# Patient Record
Sex: Male | Born: 1960
Health system: Southern US, Community
[De-identification: ages and names within clinical notes are randomized; demographics above are authoritative.]

## PROBLEM LIST (undated history)

## (undated) DIAGNOSIS — M1990 Unspecified osteoarthritis, unspecified site: Secondary | ICD-10-CM

## (undated) DIAGNOSIS — E119 Type 2 diabetes mellitus without complications: Secondary | ICD-10-CM

## (undated) DIAGNOSIS — I639 Cerebral infarction, unspecified: Secondary | ICD-10-CM

## (undated) DIAGNOSIS — F329 Major depressive disorder, single episode, unspecified: Secondary | ICD-10-CM

## (undated) DIAGNOSIS — E785 Hyperlipidemia, unspecified: Secondary | ICD-10-CM

## (undated) DIAGNOSIS — F32A Depression, unspecified: Secondary | ICD-10-CM

## (undated) DIAGNOSIS — I10 Essential (primary) hypertension: Secondary | ICD-10-CM

## (undated) HISTORY — PX: TIBIA FRACTURE SURGERY: SHX806

## (undated) HISTORY — PX: OTHER SURGICAL HISTORY: SHX169

## (undated) HISTORY — PX: TONSILLECTOMY: SUR1361

## (undated) HISTORY — DX: Type 2 diabetes mellitus without complications: E11.9

## (undated) HISTORY — DX: Hyperlipidemia, unspecified: E78.5

---

## 2005-05-18 ENCOUNTER — Ambulatory Visit (HOSPITAL_COMMUNITY): Admission: RE | Admit: 2005-05-18 | Discharge: 2005-05-18 | Payer: Self-pay | Admitting: *Deleted

## 2005-05-18 ENCOUNTER — Ambulatory Visit (HOSPITAL_BASED_OUTPATIENT_CLINIC_OR_DEPARTMENT_OTHER): Admission: RE | Admit: 2005-05-18 | Discharge: 2005-05-18 | Payer: Self-pay | Admitting: *Deleted

## 2005-09-06 DIAGNOSIS — I639 Cerebral infarction, unspecified: Secondary | ICD-10-CM

## 2005-09-06 HISTORY — DX: Cerebral infarction, unspecified: I63.9

## 2005-10-26 ENCOUNTER — Inpatient Hospital Stay (HOSPITAL_COMMUNITY)
Admission: RE | Admit: 2005-10-26 | Discharge: 2005-11-23 | Payer: Self-pay | Admitting: Physical Medicine & Rehabilitation

## 2005-10-26 ENCOUNTER — Ambulatory Visit: Payer: Self-pay | Admitting: Physical Medicine & Rehabilitation

## 2005-12-27 ENCOUNTER — Encounter
Admission: RE | Admit: 2005-12-27 | Discharge: 2006-03-27 | Payer: Self-pay | Admitting: Physical Medicine & Rehabilitation

## 2005-12-27 ENCOUNTER — Ambulatory Visit: Payer: Self-pay | Admitting: Physical Medicine & Rehabilitation

## 2006-02-22 ENCOUNTER — Ambulatory Visit: Payer: Self-pay | Admitting: Physical Medicine & Rehabilitation

## 2010-09-27 ENCOUNTER — Encounter: Payer: Self-pay | Admitting: Physical Medicine & Rehabilitation

## 2012-06-08 ENCOUNTER — Encounter (HOSPITAL_COMMUNITY): Payer: Self-pay | Admitting: Pharmacy Technician
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
## 2012-06-15 ENCOUNTER — Encounter (HOSPITAL_COMMUNITY): Payer: Self-pay

## 2012-06-15 ENCOUNTER — Ambulatory Visit (HOSPITAL_COMMUNITY)
Admission: RE | Admit: 2012-06-15 | Discharge: 2012-06-15 | Disposition: A | Discharge status: Home / Self Care | Payer: Medicare Other | Source: Ambulatory Visit | Attending: Orthopedic Surgery | Admitting: Orthopedic Surgery

## 2012-06-15 ENCOUNTER — Encounter (HOSPITAL_COMMUNITY)
Admission: RE | Admit: 2012-06-15 | Discharge: 2012-06-15 | Disposition: A | Discharge status: Home / Self Care | Payer: Medicare Other | Source: Ambulatory Visit | Attending: Orthopedic Surgery | Admitting: Orthopedic Surgery

## 2012-06-15 DIAGNOSIS — Z01812 Encounter for preprocedural laboratory examination: Secondary | ICD-10-CM | POA: Insufficient documentation

## 2012-06-15 DIAGNOSIS — Z01818 Encounter for other preprocedural examination: Secondary | ICD-10-CM | POA: Insufficient documentation

## 2012-06-15 HISTORY — DX: Major depressive disorder, single episode, unspecified: F32.9

## 2012-06-15 HISTORY — DX: Cerebral infarction, unspecified: I63.9

## 2012-06-15 HISTORY — DX: Essential (primary) hypertension: I10

## 2012-06-15 HISTORY — DX: Unspecified osteoarthritis, unspecified site: M19.90

## 2012-06-15 HISTORY — DX: Depression, unspecified: F32.A

## 2012-06-15 LAB — URINALYSIS, ROUTINE W REFLEX MICROSCOPIC
Bilirubin Urine: NEGATIVE
Glucose, UA: >1000 mg/dL — AB
Hgb urine dipstick: NEGATIVE
Specific Gravity, Urine: 1.022 (ref 1.005–1.030)
Urobilinogen, UA: 0.2 mg/dL (ref 0.0–1.0)

## 2012-06-15 LAB — CBC
Hemoglobin: 15.7 g/dL (ref 13.0–17.0)
MCH: 29.8 pg (ref 26.0–34.0)
MCHC: 34.4 g/dL (ref 30.0–36.0)

## 2012-06-15 LAB — BASIC METABOLIC PANEL
BUN: 21 mg/dL (ref 6–23)
Calcium: 9.2 mg/dL (ref 8.4–10.5)
GFR calc non Af Amer: >90 mL/min (ref >90)
Glucose, Bld: 179 mg/dL — ABNORMAL HIGH (ref 70–99)
Potassium: 3.6 mEq/L (ref 3.5–5.1)

## 2012-06-15 LAB — SURGICAL PCR SCREEN
MRSA, PCR: NEGATIVE
Staphylococcus aureus: POSITIVE — AB

## 2012-06-15 LAB — PROTIME-INR: Prothrombin Time: 13.1 seconds (ref 11.6–15.2)

## 2012-06-15 NOTE — Pre-Procedure Instructions (Signed)
PT'S URINE GLUCOSE GREATER THAN 1000 AND BLOOD GLUCOSE 179.  NO HX GIVEN BY PT OF ELEVATED GLUCOSE.  BMET AND URINALYSIS REPORTS FAXED TO DR. Nilsa Nutting OFFICE VIA [employer] FAX.

## 2012-06-15 NOTE — Pre-Procedure Instructions (Signed)
PCR, CBC, BMET, PT, PTT, UA, CXR WERE DONE TODAY AT [facility] AS PER ORDERS DR. OLIN AND ANESTHESIOLOGIST'S GUIDELINES. PT HAS EKG REPORT ON HIS CHART FROM DR. PERRY ([facility], [place]

## 2012-06-15 NOTE — Patient Instructions (Addendum)
YOUR SURGERY IS SCHEDULED AT [facility]  ON:  Tuesday  10/15  AT 10:30 AM  REPORT TO [facility] SHORT STAY [facility]ENTER AT:  8:00 AM      PHONE # FOR SHORT STAY IS [phone]  DO NOT EAT OR DRINK ANYTHING AFTER MIDNIGHT THE NIGHT BEFORE YOUR SURGERY.  YOU MAY BRUSH YOUR TEETH, RINSE OUT YOUR MOUTH--BUT NO WATER, NO FOOD, NO CHEWING GUM, NO MINTS, NO CANDIES, NO CHEWING TOBACCO.  PLEASE TAKE THE FOLLOWING MEDICATIONS THE AM OF YOUR SURGERY WITH A FEW SIPS OF WATER:  BIS[AR. FLUOXETINE, METOPROLOL, CRESTOR   IF YOU USE INHALERS--USE YOUR INHALERS THE AM OF YOUR SURGERY AND BRING INHALERS TO THE HOSPITAL -TAKE TO SURGERY.    IF YOU ARE DIABETIC:  DO NOT TAKE ANY DIABETIC MEDICATIONS THE AM OF YOUR SURGERY.  IF YOU TAKE INSULIN IN THE EVENINGS--PLEASE ONLY TAKE 1/2 NORMAL EVENING DOSE THE NIGHT BEFORE YOUR SURGERY.  NO INSULIN THE AM OF YOUR SURGERY.  IF YOU HAVE SLEEP APNEA AND USE CPAP OR BIPAP--PLEASE BRING THE MASK AND THE TUBING.  DO NOT BRING YOUR MACHINE.  DO NOT BRING VALUABLES, MONEY, CREDIT CARDS.  DO NOT WEAR JEWELRY, MAKE-UP, NAIL POLISH AND NO METAL PINS OR CLIPS IN YOUR HAIR. CONTACT LENS, DENTURES / PARTIALS, GLASSES SHOULD NOT BE WORN TO SURGERY AND IN MOST CASES-HEARING AIDS WILL NEED TO BE REMOVED.  BRING YOUR GLASSES CASE, ANY EQUIPMENT NEEDED FOR YOUR CONTACT LENS. FOR PATIENTS ADMITTED TO THE HOSPITAL--CHECK OUT TIME THE DAY OF DISCHARGE IS 11:00 AM.  ALL INPATIENT ROOMS ARE PRIVATE - WITH BATHROOM, TELEPHONE, TELEVISION AND WIFI INTERNET.  IF YOU ARE BEING DISCHARGED THE SAME DAY OF YOUR SURGERY--YOU CAN NOT DRIVE YOURSELF HOME--AND SHOULD NOT GO HOME ALONE BY TAXI OR BUS.  NO DRIVING OR OPERATING MACHINERY FOR 24 HOURS FOLLOWING ANESTHESIA / PAIN MEDICATIONS.  PLEASE MAKE ARRANGEMENTS FOR SOMEONE TO BE WITH YOU AT HOME THE FIRST 24 HOURS AFTER SURGERY. RESPONSIBLE DRIVER'S NAME___________________________                                               PHONE #    _______________________                                  PLEASE READ OVER ANY  FACT SHEETS THAT YOU WERE GIVEN: MRSA INFORMATION, BLOOD TRANSFUSION INFORMATION, INCENTIVE SPIROMETER INFORMATION.

## 2012-06-20 ENCOUNTER — Ambulatory Visit (HOSPITAL_COMMUNITY): Payer: Medicare Other

## 2012-06-20 ENCOUNTER — Encounter (HOSPITAL_COMMUNITY): Payer: Self-pay | Admitting: Certified Registered Nurse Anesthetist

## 2012-06-20 ENCOUNTER — Encounter (HOSPITAL_COMMUNITY)
Admission: RE | Disposition: A | Discharge status: Home / Self Care | Payer: Self-pay | Source: Ambulatory Visit | Attending: Orthopedic Surgery

## 2012-06-20 ENCOUNTER — Ambulatory Visit (HOSPITAL_COMMUNITY): Payer: Medicare Other | Admitting: Certified Registered Nurse Anesthetist

## 2012-06-20 ENCOUNTER — Observation Stay (HOSPITAL_COMMUNITY)
Admission: RE | Admit: 2012-06-20 | Discharge: 2012-06-21 | Disposition: A | Discharge status: Home / Self Care | Payer: Medicare Other | Source: Ambulatory Visit | Attending: Orthopedic Surgery | Admitting: Orthopedic Surgery

## 2012-06-20 ENCOUNTER — Encounter (HOSPITAL_COMMUNITY): Payer: Self-pay | Admitting: *Deleted

## 2012-06-20 DIAGNOSIS — F3289 Other specified depressive episodes: Secondary | ICD-10-CM | POA: Insufficient documentation

## 2012-06-20 DIAGNOSIS — F329 Major depressive disorder, single episode, unspecified: Secondary | ICD-10-CM | POA: Insufficient documentation

## 2012-06-20 DIAGNOSIS — I10 Essential (primary) hypertension: Secondary | ICD-10-CM | POA: Insufficient documentation

## 2012-06-20 DIAGNOSIS — R29898 Other symptoms and signs involving the musculoskeletal system: Secondary | ICD-10-CM | POA: Insufficient documentation

## 2012-06-20 DIAGNOSIS — I69998 Other sequelae following unspecified cerebrovascular disease: Secondary | ICD-10-CM | POA: Insufficient documentation

## 2012-06-20 DIAGNOSIS — Z472 Encounter for removal of internal fixation device: Principal | ICD-10-CM | POA: Insufficient documentation

## 2012-06-20 DIAGNOSIS — Z79899 Other long term (current) drug therapy: Secondary | ICD-10-CM | POA: Insufficient documentation

## 2012-06-20 DIAGNOSIS — M1710 Unilateral primary osteoarthritis, unspecified knee: Secondary | ICD-10-CM | POA: Insufficient documentation

## 2012-06-20 DIAGNOSIS — Z969 Presence of functional implant, unspecified: Secondary | ICD-10-CM

## 2012-06-20 HISTORY — PX: HARDWARE REMOVAL: SHX979

## 2012-06-20 LAB — TYPE AND SCREEN
ABO/RH(D): O POS
Antibody Screen: NEGATIVE

## 2012-06-20 LAB — ABO/RH: ABO/RH(D): O POS

## 2012-06-20 SURGERY — REMOVAL, HARDWARE
Anesthesia: Spinal | Site: Knee | Laterality: Right | Wound class: Clean

## 2012-06-20 MED ORDER — ONDANSETRON HCL 4 MG/2ML IJ SOLN
INTRAMUSCULAR | Status: DC | PRN
  Administered 2012-06-20: 4 mg via INTRAVENOUS

## 2012-06-20 MED ORDER — DOCUSATE SODIUM 100 MG PO CAPS
100.0000 mg | ORAL_CAPSULE | Freq: Two times a day (BID) | ORAL | Status: DC
  Administered 2012-06-20 – 2012-06-21 (×2): 100 mg via ORAL

## 2012-06-20 MED ORDER — BUSPIRONE HCL 15 MG PO TABS
15.0000 mg | ORAL_TABLET | Freq: Every day | ORAL | Status: DC
  Administered 2012-06-21: 15 mg via ORAL
  Filled 2012-06-20 (×2): qty 1

## 2012-06-20 MED ORDER — ACETAMINOPHEN 10 MG/ML IV SOLN
INTRAVENOUS | Status: DC | PRN
  Administered 2012-06-20: 1000 mg via INTRAVENOUS

## 2012-06-20 MED ORDER — LACTATED RINGERS IV SOLN
INTRAVENOUS | Status: DC | PRN
  Administered 2012-06-20: 10:00:00 via INTRAVENOUS

## 2012-06-20 MED ORDER — HYDROMORPHONE HCL PF 1 MG/ML IJ SOLN
0.5000 mg | INTRAMUSCULAR | Status: DC | PRN
  Administered 2012-06-20: 0.5 mg via INTRAVENOUS
  Filled 2012-06-20: qty 1

## 2012-06-20 MED ORDER — PROPOFOL 10 MG/ML IV BOLUS
INTRAVENOUS | Status: DC | PRN
  Administered 2012-06-20: 200 mg via INTRAVENOUS

## 2012-06-20 MED ORDER — SODIUM CHLORIDE 0.9 % IV SOLN
INTRAVENOUS | Status: DC
  Administered 2012-06-20 – 2012-06-21 (×2): via INTRAVENOUS
  Filled 2012-06-20 (×4): qty 1000

## 2012-06-20 MED ORDER — ATROPINE SULFATE 0.4 MG/ML IJ SOLN
INTRAMUSCULAR | Status: DC | PRN
  Administered 2012-06-20 (×2): 0.4 mg via INTRAVENOUS

## 2012-06-20 MED ORDER — POLYETHYLENE GLYCOL 3350 17 G PO PACK: 17.0000 g | PACK | Freq: Every day | ORAL | Status: DC | PRN

## 2012-06-20 MED ORDER — HYDROCODONE-ACETAMINOPHEN 7.5-325 MG PO TABS
1.0000 | ORAL_TABLET | ORAL | Status: DC | PRN
  Administered 2012-06-20: 2 via ORAL
  Administered 2012-06-20 (×2): 1 via ORAL
  Administered 2012-06-20 – 2012-06-21 (×4): 2 via ORAL
  Filled 2012-06-20: qty 1
  Filled 2012-06-20 (×3): qty 2
  Filled 2012-06-20: qty 1
  Filled 2012-06-20 (×2): qty 2

## 2012-06-20 MED ORDER — LACTATED RINGERS IV SOLN: INTRAVENOUS | Status: DC | PRN

## 2012-06-20 MED ORDER — CLOPIDOGREL BISULFATE 75 MG PO TABS
75.0000 mg | ORAL_TABLET | Freq: Every day | ORAL | Status: DC
  Administered 2012-06-21: 75 mg via ORAL
  Filled 2012-06-20 (×2): qty 1

## 2012-06-20 MED ORDER — HYDROCHLOROTHIAZIDE 25 MG PO TABS
25.0000 mg | ORAL_TABLET | Freq: Every day | ORAL | Status: DC
  Administered 2012-06-21: 25 mg via ORAL
  Filled 2012-06-20: qty 1

## 2012-06-20 MED ORDER — OXYCODONE HCL 5 MG PO TABS: 5.0000 mg | ORAL_TABLET | Freq: Once | ORAL | Status: DC | PRN

## 2012-06-20 MED ORDER — FERROUS SULFATE 325 (65 FE) MG PO TABS
325.0000 mg | ORAL_TABLET | Freq: Three times a day (TID) | ORAL | Status: DC
  Administered 2012-06-20 – 2012-06-21 (×2): 325 mg via ORAL
  Filled 2012-06-20 (×5): qty 1

## 2012-06-20 MED ORDER — ONDANSETRON HCL 4 MG PO TABS: 4.0000 mg | ORAL_TABLET | Freq: Four times a day (QID) | ORAL | Status: DC | PRN

## 2012-06-20 MED ORDER — MEPERIDINE HCL 50 MG/ML IJ SOLN: 6.2500 mg | INTRAMUSCULAR | Status: DC | PRN

## 2012-06-20 MED ORDER — CEFAZOLIN SODIUM-DEXTROSE 2-3 GM-% IV SOLR
INTRAVENOUS | Status: AC
  Filled 2012-06-20: qty 50

## 2012-06-20 MED ORDER — ACETAMINOPHEN 10 MG/ML IV SOLN
INTRAVENOUS | Status: AC
  Filled 2012-06-20: qty 100

## 2012-06-20 MED ORDER — CEFAZOLIN SODIUM 1-5 GM-% IV SOLN
1.0000 g | Freq: Four times a day (QID) | INTRAVENOUS | Status: AC
  Administered 2012-06-20 – 2012-06-21 (×3): 1 g via INTRAVENOUS
  Filled 2012-06-20 (×3): qty 50

## 2012-06-20 MED ORDER — FENTANYL CITRATE 0.05 MG/ML IJ SOLN
INTRAMUSCULAR | Status: DC | PRN
  Administered 2012-06-20: 150 ug via INTRAVENOUS
  Administered 2012-06-20: 100 ug via INTRAVENOUS

## 2012-06-20 MED ORDER — METOPROLOL SUCCINATE ER 50 MG PO TB24
50.0000 mg | ORAL_TABLET | Freq: Every day | ORAL | Status: DC
  Administered 2012-06-21: 50 mg via ORAL
  Filled 2012-06-20: qty 1

## 2012-06-20 MED ORDER — SUCCINYLCHOLINE CHLORIDE 20 MG/ML IJ SOLN
INTRAMUSCULAR | Status: DC | PRN
  Administered 2012-06-20: 100 mg via INTRAVENOUS

## 2012-06-20 MED ORDER — PROMETHAZINE HCL 25 MG/ML IJ SOLN: 6.2500 mg | INTRAMUSCULAR | Status: DC | PRN

## 2012-06-20 MED ORDER — HYDROMORPHONE HCL PF 1 MG/ML IJ SOLN: 0.2000 mg | INTRAMUSCULAR | Status: DC | PRN

## 2012-06-20 MED ORDER — HYDROMORPHONE HCL PF 1 MG/ML IJ SOLN
INTRAMUSCULAR | Status: AC
  Filled 2012-06-20: qty 1

## 2012-06-20 MED ORDER — LIDOCAINE HCL (CARDIAC) 20 MG/ML IV SOLN
INTRAVENOUS | Status: DC | PRN
  Administered 2012-06-20: 50 mg via INTRAVENOUS

## 2012-06-20 MED ORDER — OXYCODONE HCL 5 MG/5ML PO SOLN
5.0000 mg | Freq: Once | ORAL | Status: DC | PRN
  Filled 2012-06-20: qty 5

## 2012-06-20 MED ORDER — HYDROMORPHONE HCL PF 1 MG/ML IJ SOLN
0.2500 mg | INTRAMUSCULAR | Status: DC | PRN
  Administered 2012-06-20 (×2): 0.25 mg via INTRAVENOUS

## 2012-06-20 MED ORDER — DIPHENHYDRAMINE HCL 12.5 MG/5ML PO ELIX
25.0000 mg | ORAL_SOLUTION | Freq: Four times a day (QID) | ORAL | Status: DC | PRN
  Administered 2012-06-21: 25 mg via ORAL
  Filled 2012-06-20: qty 10

## 2012-06-20 MED ORDER — FLUOXETINE HCL 20 MG PO CAPS
20.0000 mg | ORAL_CAPSULE | Freq: Every day | ORAL | Status: DC
  Administered 2012-06-21: 20 mg via ORAL
  Filled 2012-06-20: qty 1

## 2012-06-20 MED ORDER — DEXAMETHASONE SODIUM PHOSPHATE 10 MG/ML IJ SOLN: 10.0000 mg | Freq: Once | INTRAMUSCULAR | Status: DC

## 2012-06-20 MED ORDER — METHOCARBAMOL 500 MG PO TABS
500.0000 mg | ORAL_TABLET | Freq: Four times a day (QID) | ORAL | Status: DC | PRN
  Administered 2012-06-21: 500 mg via ORAL
  Filled 2012-06-20: qty 1

## 2012-06-20 MED ORDER — CEFAZOLIN SODIUM-DEXTROSE 2-3 GM-% IV SOLR
2.0000 g | INTRAVENOUS | Status: AC
  Administered 2012-06-20: 2 g via INTRAVENOUS

## 2012-06-20 MED ORDER — 0.9 % SODIUM CHLORIDE (POUR BTL) OPTIME
TOPICAL | Status: DC | PRN
  Administered 2012-06-20: 1000 mL

## 2012-06-20 MED ORDER — ONDANSETRON HCL 4 MG/2ML IJ SOLN: 4.0000 mg | Freq: Four times a day (QID) | INTRAMUSCULAR | Status: DC | PRN

## 2012-06-20 MED ORDER — ACETAMINOPHEN 10 MG/ML IV SOLN: 1000.0000 mg | Freq: Once | INTRAVENOUS | Status: DC | PRN

## 2012-06-20 MED ORDER — METHOCARBAMOL 100 MG/ML IJ SOLN
500.0000 mg | Freq: Four times a day (QID) | INTRAVENOUS | Status: DC | PRN
  Administered 2012-06-20 (×2): 500 mg via INTRAVENOUS
  Filled 2012-06-20 (×2): qty 5

## 2012-06-20 MED ORDER — SENNA 8.6 MG PO TABS
1.0000 | ORAL_TABLET | Freq: Two times a day (BID) | ORAL | Status: DC
  Administered 2012-06-20 – 2012-06-21 (×2): 8.6 mg via ORAL
  Filled 2012-06-20 (×2): qty 1

## 2012-06-20 MED ORDER — MIDAZOLAM HCL 5 MG/5ML IJ SOLN
INTRAMUSCULAR | Status: DC | PRN
  Administered 2012-06-20: 2 mg via INTRAVENOUS

## 2012-06-20 MED ORDER — CHLORHEXIDINE GLUCONATE 4 % EX LIQD
60.0000 mL | Freq: Once | CUTANEOUS | Status: DC
  Filled 2012-06-20: qty 60

## 2012-06-20 MED ORDER — CISATRACURIUM BESYLATE (PF) 10 MG/5ML IV SOLN
INTRAVENOUS | Status: DC | PRN
  Administered 2012-06-20: 6 mg via INTRAVENOUS

## 2012-06-20 SURGICAL SUPPLY — 45 items
ADH SKN CLS APL DERMABOND .7 (GAUZE/BANDAGES/DRESSINGS) ×1
BAG SPEC THK2 15X12 ZIP CLS (MISCELLANEOUS)
BAG ZIPLOCK 12X15 (MISCELLANEOUS) ×1 IMPLANT
BANDAGE ELASTIC 6 VELCRO ST LF (GAUZE/BANDAGES/DRESSINGS) ×1 IMPLANT
CLOTH BEACON ORANGE TIMEOUT ST (SAFETY) ×2 IMPLANT
DERMABOND ADVANCED (GAUZE/BANDAGES/DRESSINGS) ×1
DERMABOND ADVANCED .7 DNX12 (GAUZE/BANDAGES/DRESSINGS) IMPLANT
DRAPE C-ARM 42X72 X-RAY (DRAPES) ×1 IMPLANT
DRAPE ORTHO SPLIT 77X108 STRL (DRAPES) ×4
DRAPE STERI IOBAN 125X83 (DRAPES) ×1 IMPLANT
DRAPE SURG ORHT 6 SPLT 77X108 (DRAPES) IMPLANT
DRAPE U-SHAPE 47X51 STRL (DRAPES) ×1 IMPLANT
DRSG ADAPTIC 3X8 NADH LF (GAUZE/BANDAGES/DRESSINGS) ×1 IMPLANT
DRSG EMULSION OIL 3X16 NADH (GAUZE/BANDAGES/DRESSINGS) ×1 IMPLANT
DRSG PAD ABDOMINAL 8X10 ST (GAUZE/BANDAGES/DRESSINGS) ×2 IMPLANT
DURAPREP 26ML APPLICATOR (WOUND CARE) ×2 IMPLANT
ELECT REM PT RETURN 9FT ADLT (ELECTROSURGICAL) ×2
ELECTRODE REM PT RTRN 9FT ADLT (ELECTROSURGICAL) ×1 IMPLANT
GLOVE BIOGEL PI IND STRL 7.5 (GLOVE) ×1 IMPLANT
GLOVE BIOGEL PI IND STRL 8 (GLOVE) ×1 IMPLANT
GLOVE BIOGEL PI INDICATOR 7.5 (GLOVE) ×1
GLOVE BIOGEL PI INDICATOR 8 (GLOVE) ×1
GLOVE ECLIPSE 8.0 STRL XLNG CF (GLOVE) ×1 IMPLANT
GLOVE ORTHO TXT STRL SZ7.5 (GLOVE) ×4 IMPLANT
GLOVE SURG ORTHO 8.0 STRL STRW (GLOVE) ×1 IMPLANT
GOWN BRE IMP PREV XXLGXLNG (GOWN DISPOSABLE) ×4 IMPLANT
GOWN STRL NON-REIN LRG LVL3 (GOWN DISPOSABLE) ×2 IMPLANT
IMMOBILIZER KNEE 22 UNIV (SOFTGOODS) ×1 IMPLANT
KIT BASIN OR (CUSTOM PROCEDURE TRAY) ×2 IMPLANT
MANIFOLD NEPTUNE II (INSTRUMENTS) ×2 IMPLANT
NS IRRIG 1000ML POUR BTL (IV SOLUTION) ×2 IMPLANT
PACK GENERAL/GYN (CUSTOM PROCEDURE TRAY) ×1 IMPLANT
PACK LOWER EXTREMITY WL (CUSTOM PROCEDURE TRAY) IMPLANT
PADDING CAST COTTON 6X4 STRL (CAST SUPPLIES) ×1 IMPLANT
POSITIONER SURGICAL ARM (MISCELLANEOUS) ×2 IMPLANT
SPONGE GAUZE 4X4 12PLY (GAUZE/BANDAGES/DRESSINGS) ×2 IMPLANT
STAPLER VISISTAT 35W (STAPLE) IMPLANT
STRIP CLOSURE SKIN 1/2X4 (GAUZE/BANDAGES/DRESSINGS) IMPLANT
SUT MNCRL AB 4-0 PS2 18 (SUTURE) ×1 IMPLANT
SUT VIC AB 1 CT1 27 (SUTURE) ×6
SUT VIC AB 1 CT1 27XBRD ANTBC (SUTURE) ×1 IMPLANT
SUT VIC AB 2-0 CT1 27 (SUTURE) ×4
SUT VIC AB 2-0 CT1 TAPERPNT 27 (SUTURE) ×1 IMPLANT
TOWEL OR 17X26 10 PK STRL BLUE (TOWEL DISPOSABLE) ×4 IMPLANT
WATER STERILE IRR 1500ML POUR (IV SOLUTION) ×1 IMPLANT

## 2012-06-20 NOTE — Interval H&P Note (Signed)
History and Physical Interval Note:  06/20/2012 8:39 AM  Marcus Klein  has presented today for surgery, with the diagnosis of Right Knee Retained Hardware  The various methods of treatment have been discussed with the patient and family. After consideration of risks, benefits and other options for treatment, the patient has consented to  Procedure(s) (LRB) with comments: RIGHT femoral HARDWARE REMOVAL (Right) - Synthes Liss Plate Removal as a surgical intervention .  The patient's history has been reviewed, patient examined, no change in status, stable for surgery.  I have reviewed the patient's chart and labs.  Questions were answered to the patient's satisfaction.     [name]

## 2012-06-20 NOTE — Anesthesia Procedure Notes (Signed)
Procedure Name: Intubation Date/Time: 06/20/2012 10:30 AM Performed by: Hulan Fess Pre-anesthesia Checklist: Patient identified, Emergency Drugs available, Suction available, Patient being monitored and Timeout performed Patient Re-evaluated:Patient Re-evaluated prior to inductionOxygen Delivery Method: Circle system utilized Preoxygenation: Pre-oxygenation with 100% oxygen Intubation Type: IV induction Laryngoscope Size: Mac and 3 Grade View: Grade II Tube type: Oral Tube size: 8.0 mm Number of attempts: 1 Placement Confirmation: ETT inserted through vocal cords under direct vision and positive ETCO2 Secured at: 3 cm Tube secured with: Tape Dental Injury: Teeth and Oropharynx as per pre-operative assessment

## 2012-06-20 NOTE — Anesthesia Postprocedure Evaluation (Signed)
Anesthesia Post Note  Patient: Marcus Klein  Procedure(s) Performed: Procedure(s) (LRB): HARDWARE REMOVAL (Right)  Anesthesia type: General  Patient location: PACU  Post pain: Pain level controlled  Post assessment: Post-op Vital signs reviewed  Last Vitals: BP 114/74  Pulse 67  Temp 36.5 C  Resp 16  SpO2 98%  Post vital signs: Reviewed  Level of consciousness: sedated  Complications: No apparent anesthesia complications

## 2012-06-20 NOTE — Transfer of Care (Signed)
Immediate Anesthesia Transfer of Care Note  Patient: Marcus Klein  Procedure(s) Performed: Procedure(s) (LRB) with comments: HARDWARE REMOVAL (Right) - Synthes Liss Plate Removal  Patient Location: PACU  Anesthesia Type: General  Level of Consciousness: awake, alert  and oriented  Airway & Oxygen Therapy: Patient Spontanous Breathing and Patient connected to face mask oxygen  Post-op Assessment: Report given to PACU RN  Post vital signs: Reviewed and stable  Complications: No apparent anesthesia complications

## 2012-06-20 NOTE — Brief Op Note (Signed)
06/20/2012  11:21 AM  PATIENT:  Marcus Klein  51 y.o. male  PRE-OPERATIVE DIAGNOSIS:  Right Knee Retained Hardware  POST-OPERATIVE DIAGNOSIS:  Right Knee Retained Hardware  PROCEDURE:  Procedure(s) (LRB) with comments: HARDWARE REMOVAL (Right) - Removal distal femoral plate hardware and screws  SURGEON:  Surgeon(s) and Role:    * [name], MD - Primary  PHYSICIAN ASSISTANT: Leilani Able, PA-C  ANESTHESIA:   spinal  EBL:     BLOOD ADMINISTERED:none  DRAINS: none   LOCAL MEDICATIONS USED:  NONE  SPECIMEN:  No Specimen  DISPOSITION OF SPECIMEN:  Plate and screws cleaned for patient  COUNTS:  YES  TOURNIQUET:  * Missing tourniquet times found for documented tourniquets in log:  [id number] *  DICTATION: .Other Dictation: Dictation Number [id number]  PLAN OF CARE: Admit for overnight observation  PATIENT DISPOSITION:  PACU - hemodynamically stable.   Delay start of Pharmacological VTE agent (>24hrs) due to surgical blood loss or risk of bleeding: no

## 2012-06-20 NOTE — Anesthesia Preprocedure Evaluation (Addendum)
Anesthesia Evaluation  Patient identified by MRN, date of birth, ID band Patient awake    Reviewed: Allergy & Precautions, H&P , NPO status , Patient's Chart, lab work & pertinent test results  Airway Mallampati: II TM Distance: >3 FB Neck ROM: Full    Dental  (+) Teeth Intact and Dental Advisory Given   Pulmonary  breath sounds clear to auscultation  Pulmonary exam normal       Cardiovascular hypertension, Pt. on medications Rhythm:Regular Rate:Normal     Neuro/Psych PSYCHIATRIC DISORDERS Depression CVA, Residual Symptoms    GI/Hepatic negative GI ROS, Neg liver ROS,   Endo/Other  negative endocrine ROS  Renal/GU negative Renal ROS     Musculoskeletal negative musculoskeletal ROS (+)   Abdominal (+) + obese,   Peds  Hematology negative hematology ROS (+)   Anesthesia Other Findings   Reproductive/Obstetrics                         Anesthesia Physical Anesthesia Plan  ASA: II  Anesthesia Plan: General   Post-op Pain Management:    Induction:   Airway Management Planned: LMA  Additional Equipment:   Intra-op Plan:   Post-operative Plan: Extubation in OR  Informed Consent: I have reviewed the patients History and Physical, chart, labs and discussed the procedure including the risks, benefits and alternatives for the proposed anesthesia with the patient or authorized representative who has indicated his/her understanding and acceptance.   Dental advisory given  Plan Discussed with: CRNA  Anesthesia Plan Comments:        Anesthesia Quick Evaluation

## 2012-06-21 ENCOUNTER — Encounter (HOSPITAL_COMMUNITY): Payer: Self-pay | Admitting: Orthopedic Surgery

## 2012-06-21 MED ORDER — DSS 100 MG PO CAPS: 100.0000 mg | ORAL_CAPSULE | Freq: Two times a day (BID) | ORAL | Status: DC

## 2012-06-21 MED ORDER — HYDROCODONE-ACETAMINOPHEN 7.5-325 MG PO TABS: 1.0000 | ORAL_TABLET | ORAL | Status: DC | PRN

## 2012-06-21 MED ORDER — METHOCARBAMOL 500 MG PO TABS: 500.0000 mg | ORAL_TABLET | Freq: Four times a day (QID) | ORAL | Status: DC | PRN

## 2012-06-21 MED ORDER — POLYETHYLENE GLYCOL 3350 17 G PO PACK: 17.0000 g | PACK | Freq: Every day | ORAL | Status: DC | PRN

## 2012-06-21 MED ORDER — FERROUS SULFATE 325 (65 FE) MG PO TABS: 325.0000 mg | ORAL_TABLET | Freq: Three times a day (TID) | ORAL | Status: DC

## 2012-06-21 NOTE — Progress Notes (Signed)
Utilization review completed.  

## 2012-06-21 NOTE — Progress Notes (Signed)
Pt for d/c home today with [facility] PT per [place].  No IV noted. Dressing CDI to R Knee. Dressing supplies for home use given. No changes in am assessments today. Discharge instructions & Rx given with verbalized understanding. Friend will assist pt with d/c & home care as reported. Requested to eat lunch here prior to going home-lunch ordered.

## 2012-06-21 NOTE — Op Note (Signed)
NAMEEVEN, BUDLONG NO.:  [id number]  MEDICAL RECORD NO.:  [id number]  LOCATION:  1608                         FACILITY:  [facility]  PHYSICIAN:  Madlyn Frankel. Charlann Boxer, M.D.  DATE OF BIRTH:  1960-10-04  DATE OF PROCEDURE:  06/20/2012 DATE OF DISCHARGE:                              OPERATIVE REPORT   PREOPERATIVE DIAGNOSIS:  Right knee posttraumatic osteoarthritis with retained hardware in the distal femur, including 3 separate lag screws for internal fixation in addition to a long spanning lateral distal femoral plate.  POSTOPERATIVE DIAGNOSIS:  Right knee posttraumatic osteoarthritis with retained hardware in the distal femur, including 3 separate lag screws for internal fixation in addition to a long spanning lateral distal femoral plate.  PROCEDURE:  Removal of femoral hardware including plate and screws.  SURGEON:  Madlyn Frankel. Charlann Boxer, MD  ASSISTANT:  Jaquelyn Bitter. Chabon, PA-C.  Note that Mr. Chabon was present for entirety of the case facilitating preoperative positioning, perioperative soft tissue retraction, and primary wound closure.  ANESTHESIA:  General.  SPECIMENS:  None.  COMPLICATIONS:  None.  INDICATION FOR PROCEDURE:  Marcus Klein is a 51 year old male who presented to the office for second opinion and surgical evaluation of right knee osteoarthritis in setting of posttraumatic nature with retained hardware.  After reviewing with him the indications for removal of hardware prior to proceeding with knee arthroplasty, he wished to proceed in this fashion.  The risks of fracture through screw holes, infection were all reviewed in the setting of his old trauma.  If his wounds heal without evidence of any complicating features, we will plan to proceed with knee arthroplasty in the near future.  PROCEDURE IN DETAIL:  The patient was brought to the operative theater. Once adequate anesthesia, preoperative antibiotics, Ancef administered, he was positioned  supine with a bump underneath the right hip.  The right lower extremity was then prepped and draped in a sterile fashion. A time-out was performed identifying the patient, planned procedure, and extremity.  Fluoroscopy was used for this case.  Under fluoroscopic imaging identified the location of the knee joint and the plate.  He had previous anterior knee incision as well as smaller incisions over the lateral mid and proximal thigh for placement of screws.  At the area of his lateral epicondylar region, an incision was made laterally.  Soft tissue dissection was carried to iliotibial band which was split longitudinally.  The distal portion of his lateral femoral plate was identified and all 5 screws from this area were removed without difficulty or complication.  Under fluoroscopic imaging, confirmed that they were all removed.  In the midportion of the thigh and plate, there were 2 screws that were identified through the old incision and under fluoroscopy and removed without difficulty and in total.  More proximally there were 2 screws, 1 of the screws had broken off at the screw head.  The other screw was removed without difficulty again through this previously placed incisions.  At this point using subperiosteal elevators, the plate was elevated off the lateral femur and then removed from the distal incision.  At this point, all wounds were copiously irrigated with normal saline solution.  All  deep fascial portions of the incision including iliotibial band distally as well as proximally were closed with #1 Vicryl.  The remainder of the wounds were closed with 2-0 Vicryl and Monocryl sutures.  The skin was cleaned, dried, and dressed sterilely using Dermabond and Aquacel dressing.  The patient was then brought to the recovery room in stable condition, tolerated the procedure well.  He will plan to stay overnight for pain control and antibiotics and will return to see [place] in the  office 2 weeks for wound evaluation and schedule for knee replacement from there depending on release from procedure.     Madlyn Frankel Charlann Boxer, M.D.     MDO/MEDQ  D:  06/20/2012  T:  06/21/2012  Job:  [id number]

## 2012-06-21 NOTE — Evaluation (Signed)
Physical Therapy Evaluation Patient Details Name: Marcus Klein MRN: [id number] DOB: Jan 31, 1961 Today's Date: 06/21/2012 Time: 4098-1191 PT Time Calculation (min): 12 min  PT Assessment / Plan / Recommendation Clinical Impression  Pt presents s/p hardware removal from R knee with plan for TKA in 6 weeks in order to allow bone to heal.  Pt with past history of R sided weakness from CVA, which is probable cause for limited mobiltiy today as well as increased pain.  Tolerated OOB and some ambulation in hallway, however very limited due to pain (10/10).  Pt will benefit from skilled PT in acute venue to address deficits, however is planning for D/C today.  PT recommends 24/7 supervision/assist with HHPT for follow up.      PT Assessment  Patient needs continued PT services    Follow Up Recommendations  Home health PT;Supervision/Assistance - 24 hour    Does the patient have the potential to tolerate intense rehabilitation      Barriers to Discharge None      Equipment Recommendations  None recommended by PT    Recommendations for Other Services     Frequency Min 5X/week    Precautions / Restrictions Precautions Precautions: Knee Precaution Comments: Pt with history of R sided weakness from CVA Required Braces or Orthoses: Knee Immobilizer - Right Knee Immobilizer - Right: Discontinue once straight leg raise with < 10 degree lag Restrictions Weight Bearing Restrictions: Yes Other Position/Activity Restrictions: 50%   Pertinent Vitals/Pain 10/10 pain, RN aware.       Mobility  Bed Mobility Bed Mobility: Supine to Sit;Sitting - Scoot to Edge of Bed Supine to Sit: 3: Mod assist;4: Min assist Sitting - Scoot to Edge of Bed: 5: Supervision Details for Bed Mobility Assistance: Min/Mod to get to EOB with assist for RLE out of bed and some assist for trunk.  Pt with increased difficulty elevating trunk with max cues for hand placement to self assist.  Noted difficulty using R UE  due to past CVA.  Transfers Transfers: Sit to Stand;Stand to Sit Sit to Stand: 4: Min assist;3: Mod assist;From elevated surface;With upper extremity assist;From bed Stand to Sit: 3: Mod assist;With upper extremity assist;With armrests;To chair/3-in-1 Details for Transfer Assistance: MAX cues for safety when standing and sitting due to pt very unsafe and attempting to pull up on one side of RW and "flopped" when sitting instead of reaching back.   Ambulation/Gait Ambulation/Gait Assistance: 4: Min assist;3: Mod assist Ambulation Distance (Feet): 20 Feet Assistive device: Rolling walker Ambulation/Gait Assistance Details: Assist to steady throughout. Max cues for sequencing/technique and increasing UE WB in order to assist with antalgic gait pattern.  Pt with 10/10 pain.  RN notified.  Gait Pattern: Step-to pattern;Decreased stride length;Decreased stance time - right;Decreased step length - left;Trunk flexed;Antalgic Gait velocity: decreased General Gait Details: VERY antalgic Stairs: No Wheelchair Mobility Wheelchair Mobility: No    Shoulder Instructions     Exercises     PT Diagnosis: Difficulty walking;Generalized weakness;Acute pain  PT Problem List: Decreased strength;Decreased range of motion;Decreased activity tolerance;Decreased balance;Decreased mobility;Decreased knowledge of use of DME;Decreased safety awareness;Obesity;Pain PT Treatment Interventions: DME instruction;Gait training;Functional mobility training;Therapeutic activities;Therapeutic exercise;Balance training;Patient/family education   PT Goals Acute Rehab PT Goals PT Goal Formulation: With patient Time For Goal Achievement: 06/24/12 Potential to Achieve Goals: Good Pt will go Supine/Side to Sit: with supervision PT Goal: Supine/Side to Sit - Progress: Goal set today Pt will go Sit to Supine/Side: with supervision PT Goal: Sit to Supine/Side -  Progress: Goal set today Pt will go Sit to Stand: with  supervision PT Goal: Sit to Stand - Progress: Goal set today Pt will go Stand to Sit: with supervision PT Goal: Stand to Sit - Progress: Goal set today Pt will Ambulate: 51 - 150 feet;with supervision;with least restrictive assistive device PT Goal: Ambulate - Progress: Goal set today  Visit Information  Last PT Received On: 06/21/12 Assistance Needed: +1    Subjective Data  Subjective: I'm 10/10 pain right now Patient Stated Goal: n/a   Prior Functioning  Home Living Lives With: Spouse Available Help at Discharge: Family Type of Home: House Home Access: Ramped entrance Home Layout: Two level;Able to live on main level with bedroom/bathroom Bathroom Shower/Tub: Walk-in shower Home Adaptive Equipment: Bedside commode/3-in-1;Straight cane;Walker - rolling;Grab bars around toilet Prior Function Level of Independence: Independent with assistive device(s) Able to Take Stairs?: Yes Vocation: On disability Communication Communication: No difficulties    Cognition  Overall Cognitive Status: Impaired Area of Impairment: Safety/judgement Arousal/Alertness: Awake/alert Orientation Level: Appears intact for tasks assessed Behavior During Session: [facility] for tasks performed Safety/Judgement: Impulsive;Decreased awareness of safety precautions Safety/Judgement - Other Comments: Pt very unsafe when getting up from bed, max cues for hand placement    Extremity/Trunk Assessment Right Lower Extremity Assessment RLE ROM/Strength/Tone: Deficits;Unable to fully assess;Due to pain;Due to precautions Left Lower Extremity Assessment LLE ROM/Strength/Tone: [facility] for tasks assessed LLE Sensation: WFL - Light Touch LLE Coordination: WFL - gross/fine motor Trunk Assessment Trunk Assessment: Normal   Balance    End of Session PT - End of Session Equipment Utilized During Treatment: Gait belt;Right knee immobilizer Activity Tolerance: Patient limited by pain Patient left: in chair;with call  bell/phone within reach;with family/visitor present Nurse Communication: Mobility status;Patient requests pain meds  GP Functional Assessment Tool Used: Clinical judgement Functional Limitation: Mobility: Walking and moving around Mobility: Walking and Moving Around Current Status [phone]): At least 40 percent but less than 60 percent impaired, limited or restricted Mobility: Walking and Moving Around Goal Status [phone]): At least 20 percent but less than 40 percent impaired, limited or restricted   Lessie Dings 06/21/2012, 8:39 AM

## 2012-06-21 NOTE — Progress Notes (Signed)
   Subjective: 1 Day Post-Op Procedure(s) (LRB): HARDWARE REMOVAL (Right)   Patient reports pain as mild, pain well controlled. No events throughout the night. Ready to be discharged home.  Objective:   VITALS:   Filed Vitals:   06/21/12 0625  BP: 101/60  Pulse: 68  Temp: 98.7 F (37.1 C)  Resp: 16    Neurovascular intact Dorsiflexion/Plantar flexion intact Incision: dressing C/D/I No cellulitis present Compartment soft  LABS No new labs   Assessment/Plan: 1 Day Post-Op Procedure(s) (LRB): HARDWARE REMOVAL (Right) Advance diet Up with therapy D/C IV fluids Discharge home with home health Follow up in 2 weeks at [facility]. Follow-up Information    Follow up with OLIN,[name] in 2 weeks.   Contact information:   [facility] [address] [place] [address] [phone]          Obese (BMI 30-39.9) Estimated Body mass index is 33.58 kg/(m^2) as calculated from the following:   Height as of this encounter: 5\' 10" (1.778 m).   Weight as of this encounter: 234 lb(106.142 kg). Patient also counseled that weight may inhibit the healing process Patient counseled that losing weight will help with future health issues      Anastasio Auerbach. Marcus Klein   PAC  06/21/2012, 7:52 AM

## 2012-06-21 NOTE — Care Management Note (Signed)
    Page 1 of 2   06/21/2012     3:17:49 PM   CARE MANAGEMENT NOTE 06/21/2012  Patient:  Marcus Klein, Marcus Klein   Account Number:  [id number]  Date Initiated:  06/21/2012  Documentation initiated by:  Colleen Can  Subjective/Objective Assessment:   dx rt knee hardware removal  Pre-arranged for [facility] services to be provided by Genevieve Norlander     Action/Plan:   Cm spoke with patient. Plans are for patient to return to his home in [place] where he will have a caregiver. Already has cane and RW.   Anticipated DC Date:  06/21/2012   Anticipated DC Plan:  HOME W HOME HEALTH SERVICES  In-house referral  NA      DC Planning Services  CM consult      [facility] Choice  HOME HEALTH   Choice offered to / List presented to:  C-1 Patient   DME arranged  NA      DME agency  NA     HH arranged  HH-2 PT      [facility] agency  [facility]   Status of service:  Completed, signed off Medicare Important Message given?  NA - LOS <3 / Initial given by admissions (If response is "NO", the following Medicare IM given date fields will be blank) Date Medicare IM given:   Date Additional Medicare IM given:    Discharge Disposition:  HOME W HOME HEALTH SERVICES  Per UR Regulation:    If discussed at Long Length of Stay Meetings, dates discussed:    Comments:  06/21/2012 Raynelle Bring BSN CCM [phone] Pt discharged today with [facility] services in place. HH services to start tomorrow 06/22/2012.
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
## 2012-07-24 ENCOUNTER — Encounter (HOSPITAL_COMMUNITY): Payer: Self-pay | Admitting: Pharmacy Technician

## 2012-07-25 ENCOUNTER — Encounter (HOSPITAL_COMMUNITY)
Admission: RE | Admit: 2012-07-25 | Discharge: 2012-07-25 | Disposition: A | Discharge status: Home / Self Care | Payer: Medicare Other | Source: Ambulatory Visit | Attending: Orthopedic Surgery | Admitting: Orthopedic Surgery

## 2012-07-25 ENCOUNTER — Encounter (HOSPITAL_COMMUNITY): Payer: Self-pay

## 2012-07-25 LAB — URINALYSIS, ROUTINE W REFLEX MICROSCOPIC
Bilirubin Urine: NEGATIVE
Ketones, ur: NEGATIVE mg/dL
Leukocytes, UA: NEGATIVE
Nitrite: NEGATIVE
Protein, ur: NEGATIVE mg/dL
Urobilinogen, UA: 0.2 mg/dL (ref 0.0–1.0)
pH: 6 (ref 5.0–8.0)

## 2012-07-25 LAB — APTT: aPTT: 35 seconds (ref 24–37)

## 2012-07-25 LAB — CBC
HCT: 43.7 % (ref 39.0–52.0)
MCHC: 35 g/dL (ref 30.0–36.0)
Platelets: 264 10*3/uL (ref 150–400)
RDW: 13.7 % (ref 11.5–15.5)
WBC: 7.3 10*3/uL (ref 4.0–10.5)

## 2012-07-25 LAB — BASIC METABOLIC PANEL
BUN: 23 mg/dL (ref 6–23)
Chloride: 99 mEq/L (ref 96–112)
Creatinine, Ser: 1.03 mg/dL (ref 0.50–1.35)
GFR calc Af Amer: >90 mL/min (ref >90)
GFR calc non Af Amer: 82 mL/min — ABNORMAL LOW (ref >90)
Potassium: 3.4 mEq/L — ABNORMAL LOW (ref 3.5–5.1)

## 2012-07-25 LAB — PROTIME-INR
INR: 0.97 (ref 0.00–1.49)
Prothrombin Time: 12.8 seconds (ref 11.6–15.2)

## 2012-07-25 NOTE — Progress Notes (Signed)
07/25/12 1136  OBSTRUCTIVE SLEEP APNEA  Have you ever been diagnosed with sleep apnea through a sleep study? No  Do you snore loudly (loud enough to be heard through closed doors)?  1  Do you often feel tired, fatigued, or sleepy during the daytime? 0  Has anyone observed you stop breathing during your sleep? 0  Do you have, or are you being treated for high blood pressure? 1  BMI more than 35 kg/m2? 0  Age over 51 years old? 1  Neck circumference greater than 40 cm/18 inches? 0  Gender: 1  Obstructive Sleep Apnea Score 4   Score 4 or greater  Results sent to PCP

## 2012-07-25 NOTE — Patient Instructions (Signed)
20      Your procedure is scheduled on:  Tuesday 08/01/2012 at 1115 am  Report to [facility] at  0845 AM.  Call this number if you have problems the morning of surgery: [phone]   Remember:   Do not eat food or drink liquids after midnight!  Take these medicines the morning of surgery with A SIP OF WATER:  Prozac, Metoprolol, Crestor, Buspar   Do not bring valuables to the hospital.  .  Leave suitcase in the car. After surgery it may be brought to your room.  For patients admitted to the hospital, checkout time is 11:00 AM the day of              Discharge.    Special Instructions: See [facility] Preparing  For Surgery Instruction Sheet. Do not wear jewelry, lotions powders, perfumes. Women do not shave legs or underarms for 12 hours before showers. Contacts, partial plates, or dentures may not be worn into surgery.                          Patients discharged the day of surgery will not be allowed to drive home. If going home the same day of surgery, must have someone stay with you first 24 hrs.at home and arrange for someone to drive you home from the Hospital.             YOUR DRIVER ZO:[id number] Fess-Mom   Please read over the following fact sheets that you were given: MRSA INFORMATION,INCENTIVE SPIROMETRY SHEET, SLEEP APNEA SHEET, BLOOD TRANSFUSION SHEET                            Telford Nab.Jonnette Nuon,RN,BSN     [phone]

## 2012-07-27 NOTE — Progress Notes (Signed)
EKG from [facility] from 06/06/2012 on chart.
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
## 2012-08-01 ENCOUNTER — Encounter (HOSPITAL_COMMUNITY): Payer: Self-pay | Admitting: Anesthesiology

## 2012-08-01 ENCOUNTER — Ambulatory Visit (HOSPITAL_COMMUNITY): Payer: Medicare Other | Admitting: Anesthesiology

## 2012-08-01 ENCOUNTER — Encounter (HOSPITAL_COMMUNITY): Payer: Self-pay | Admitting: Orthopedic Surgery

## 2012-08-01 ENCOUNTER — Ambulatory Visit (HOSPITAL_COMMUNITY): Payer: Medicare Other

## 2012-08-01 ENCOUNTER — Encounter (HOSPITAL_COMMUNITY)
Admission: RE | Disposition: A | Discharge status: Home Health | Payer: Self-pay | Source: Ambulatory Visit | Attending: Orthopedic Surgery

## 2012-08-01 ENCOUNTER — Encounter (HOSPITAL_COMMUNITY): Payer: Self-pay | Admitting: *Deleted

## 2012-08-01 ENCOUNTER — Inpatient Hospital Stay (HOSPITAL_COMMUNITY)
Admission: RE | Admit: 2012-08-01 | Discharge: 2012-08-02 | DRG: 470 | Disposition: A | Discharge status: Home Health | Payer: Medicare Other | Source: Ambulatory Visit | Attending: Orthopedic Surgery | Admitting: Orthopedic Surgery

## 2012-08-01 DIAGNOSIS — Z96659 Presence of unspecified artificial knee joint: Secondary | ICD-10-CM

## 2012-08-01 DIAGNOSIS — I10 Essential (primary) hypertension: Secondary | ICD-10-CM | POA: Diagnosis present

## 2012-08-01 DIAGNOSIS — R5383 Other fatigue: Secondary | ICD-10-CM | POA: Diagnosis present

## 2012-08-01 DIAGNOSIS — I69998 Other sequelae following unspecified cerebrovascular disease: Secondary | ICD-10-CM

## 2012-08-01 DIAGNOSIS — IMO0002 Reserved for concepts with insufficient information to code with codable children: Secondary | ICD-10-CM | POA: Diagnosis present

## 2012-08-01 DIAGNOSIS — D500 Iron deficiency anemia secondary to blood loss (chronic): Secondary | ICD-10-CM | POA: Diagnosis not present

## 2012-08-01 DIAGNOSIS — F3289 Other specified depressive episodes: Secondary | ICD-10-CM | POA: Diagnosis present

## 2012-08-01 DIAGNOSIS — M12569 Traumatic arthropathy, unspecified knee: Principal | ICD-10-CM | POA: Diagnosis present

## 2012-08-01 DIAGNOSIS — E669 Obesity, unspecified: Secondary | ICD-10-CM | POA: Diagnosis present

## 2012-08-01 DIAGNOSIS — S72009S Fracture of unspecified part of neck of unspecified femur, sequela: Secondary | ICD-10-CM

## 2012-08-01 DIAGNOSIS — E871 Hypo-osmolality and hyponatremia: Secondary | ICD-10-CM | POA: Diagnosis not present

## 2012-08-01 DIAGNOSIS — Z01812 Encounter for preprocedural laboratory examination: Secondary | ICD-10-CM

## 2012-08-01 DIAGNOSIS — F329 Major depressive disorder, single episode, unspecified: Secondary | ICD-10-CM | POA: Diagnosis present

## 2012-08-01 DIAGNOSIS — R5381 Other malaise: Secondary | ICD-10-CM | POA: Diagnosis present

## 2012-08-01 DIAGNOSIS — M899 Disorder of bone, unspecified: Secondary | ICD-10-CM | POA: Diagnosis present

## 2012-08-01 DIAGNOSIS — D62 Acute posthemorrhagic anemia: Secondary | ICD-10-CM | POA: Diagnosis not present

## 2012-08-01 HISTORY — PX: TOTAL KNEE ARTHROPLASTY: SHX125

## 2012-08-01 LAB — TYPE AND SCREEN

## 2012-08-01 SURGERY — ARTHROPLASTY, KNEE, TOTAL
Anesthesia: General | Site: Knee | Laterality: Right | Wound class: Clean

## 2012-08-01 MED ORDER — SENNA 8.6 MG PO TABS
1.0000 | ORAL_TABLET | Freq: Two times a day (BID) | ORAL | Status: DC
  Administered 2012-08-01 – 2012-08-02 (×2): 8.6 mg via ORAL
  Filled 2012-08-01 (×2): qty 1

## 2012-08-01 MED ORDER — HYDROMORPHONE HCL PF 1 MG/ML IJ SOLN: 0.2000 mg | INTRAMUSCULAR | Status: DC | PRN

## 2012-08-01 MED ORDER — HYDROMORPHONE HCL PF 1 MG/ML IJ SOLN
0.2500 mg | INTRAMUSCULAR | Status: DC | PRN
  Administered 2012-08-01 (×2): 0.5 mg via INTRAVENOUS

## 2012-08-01 MED ORDER — PROMETHAZINE HCL 25 MG/ML IJ SOLN: 6.2500 mg | INTRAMUSCULAR | Status: DC | PRN

## 2012-08-01 MED ORDER — ROPIVACAINE HCL 5 MG/ML IJ SOLN
INTRAMUSCULAR | Status: DC | PRN
  Administered 2012-08-01: 30 mL

## 2012-08-01 MED ORDER — FENTANYL CITRATE 0.05 MG/ML IJ SOLN
INTRAMUSCULAR | Status: DC | PRN
  Administered 2012-08-01 (×4): 50 ug via INTRAVENOUS

## 2012-08-01 MED ORDER — FERROUS SULFATE 325 (65 FE) MG PO TABS
325.0000 mg | ORAL_TABLET | Freq: Three times a day (TID) | ORAL | Status: DC
  Administered 2012-08-01 – 2012-08-02 (×3): 325 mg via ORAL
  Filled 2012-08-01 (×5): qty 1

## 2012-08-01 MED ORDER — ONDANSETRON HCL 4 MG/2ML IJ SOLN
INTRAMUSCULAR | Status: DC | PRN
  Administered 2012-08-01: 4 mg via INTRAVENOUS

## 2012-08-01 MED ORDER — LACTATED RINGERS IV SOLN
INTRAVENOUS | Status: DC
  Administered 2012-08-01: 1000 mL via INTRAVENOUS
  Administered 2012-08-01: 11:00:00 via INTRAVENOUS

## 2012-08-01 MED ORDER — BUPIVACAINE-EPINEPHRINE 0.25% -1:200000 IJ SOLN
INTRAMUSCULAR | Status: AC
  Filled 2012-08-01: qty 1

## 2012-08-01 MED ORDER — PROPOFOL 10 MG/ML IV BOLUS
INTRAVENOUS | Status: DC | PRN
  Administered 2012-08-01: 250 mg via INTRAVENOUS

## 2012-08-01 MED ORDER — FENTANYL CITRATE 0.05 MG/ML IJ SOLN
INTRAMUSCULAR | Status: AC
  Filled 2012-08-01: qty 2

## 2012-08-01 MED ORDER — DIPHENHYDRAMINE HCL 12.5 MG/5ML PO ELIX
25.0000 mg | ORAL_SOLUTION | Freq: Four times a day (QID) | ORAL | Status: DC | PRN
  Administered 2012-08-01 (×2): 25 mg via ORAL
  Filled 2012-08-01 (×2): qty 10

## 2012-08-01 MED ORDER — BUSPIRONE HCL 15 MG PO TABS
15.0000 mg | ORAL_TABLET | Freq: Every day | ORAL | Status: DC
  Administered 2012-08-02: 15 mg via ORAL
  Filled 2012-08-01: qty 1

## 2012-08-01 MED ORDER — 0.9 % SODIUM CHLORIDE (POUR BTL) OPTIME
TOPICAL | Status: DC | PRN
  Administered 2012-08-01: 1000 mL

## 2012-08-01 MED ORDER — LIDOCAINE HCL (CARDIAC) 20 MG/ML IV SOLN
INTRAVENOUS | Status: DC | PRN
  Administered 2012-08-01: 100 mg via INTRAVENOUS

## 2012-08-01 MED ORDER — MENTHOL 3 MG MT LOZG: 1.0000 | LOZENGE | OROMUCOSAL | Status: DC | PRN

## 2012-08-01 MED ORDER — ACETAMINOPHEN 10 MG/ML IV SOLN
INTRAVENOUS | Status: DC | PRN
  Administered 2012-08-01: 1000 mg via INTRAVENOUS

## 2012-08-01 MED ORDER — FLUOXETINE HCL 20 MG PO CAPS
20.0000 mg | ORAL_CAPSULE | Freq: Every day | ORAL | Status: DC
  Administered 2012-08-02: 20 mg via ORAL
  Filled 2012-08-01: qty 1

## 2012-08-01 MED ORDER — NEOSTIGMINE METHYLSULFATE 1 MG/ML IJ SOLN
INTRAMUSCULAR | Status: DC | PRN
  Administered 2012-08-01: 5 mg via INTRAVENOUS

## 2012-08-01 MED ORDER — DOCUSATE SODIUM 100 MG PO CAPS
100.0000 mg | ORAL_CAPSULE | Freq: Two times a day (BID) | ORAL | Status: DC
  Administered 2012-08-01 – 2012-08-02 (×2): 100 mg via ORAL

## 2012-08-01 MED ORDER — KETOROLAC TROMETHAMINE 30 MG/ML IJ SOLN
INTRAMUSCULAR | Status: AC
  Filled 2012-08-01: qty 1

## 2012-08-01 MED ORDER — ROPIVACAINE HCL 5 MG/ML IJ SOLN
INTRAMUSCULAR | Status: AC
  Filled 2012-08-01: qty 30

## 2012-08-01 MED ORDER — CLOPIDOGREL BISULFATE 75 MG PO TABS
75.0000 mg | ORAL_TABLET | Freq: Every day | ORAL | Status: DC
  Administered 2012-08-02: 75 mg via ORAL
  Filled 2012-08-01 (×2): qty 1

## 2012-08-01 MED ORDER — DEXAMETHASONE SODIUM PHOSPHATE 10 MG/ML IJ SOLN
10.0000 mg | Freq: Once | INTRAMUSCULAR | Status: AC
  Administered 2012-08-01: 10 mg via INTRAVENOUS

## 2012-08-01 MED ORDER — ONDANSETRON HCL 4 MG PO TABS: 4.0000 mg | ORAL_TABLET | Freq: Four times a day (QID) | ORAL | Status: DC | PRN

## 2012-08-01 MED ORDER — METHOCARBAMOL 100 MG/ML IJ SOLN
500.0000 mg | Freq: Four times a day (QID) | INTRAVENOUS | Status: DC | PRN
  Administered 2012-08-01: 500 mg via INTRAVENOUS
  Filled 2012-08-01: qty 5

## 2012-08-01 MED ORDER — CEFAZOLIN SODIUM-DEXTROSE 2-3 GM-% IV SOLR
2.0000 g | INTRAVENOUS | Status: AC
  Administered 2012-08-01: 2 g via INTRAVENOUS

## 2012-08-01 MED ORDER — METHOCARBAMOL 500 MG PO TABS
500.0000 mg | ORAL_TABLET | Freq: Four times a day (QID) | ORAL | Status: DC | PRN
  Administered 2012-08-02 (×2): 500 mg via ORAL
  Filled 2012-08-01: qty 1

## 2012-08-01 MED ORDER — METOPROLOL SUCCINATE ER 50 MG PO TB24
50.0000 mg | ORAL_TABLET | Freq: Every day | ORAL | Status: DC
  Administered 2012-08-02: 50 mg via ORAL
  Filled 2012-08-01: qty 1

## 2012-08-01 MED ORDER — CEFAZOLIN SODIUM-DEXTROSE 2-3 GM-% IV SOLR
INTRAVENOUS | Status: AC
  Filled 2012-08-01: qty 50

## 2012-08-01 MED ORDER — CEFAZOLIN SODIUM-DEXTROSE 2-3 GM-% IV SOLR
2.0000 g | Freq: Four times a day (QID) | INTRAVENOUS | Status: AC
  Administered 2012-08-01 (×2): 2 g via INTRAVENOUS
  Filled 2012-08-01 (×2): qty 50

## 2012-08-01 MED ORDER — SODIUM CHLORIDE 0.9 % IR SOLN
Status: DC | PRN
  Administered 2012-08-01: 3000 mL

## 2012-08-01 MED ORDER — PHENOL 1.4 % MT LIQD: 1.0000 | OROMUCOSAL | Status: DC | PRN

## 2012-08-01 MED ORDER — GLYCOPYRROLATE 0.2 MG/ML IJ SOLN
INTRAMUSCULAR | Status: DC | PRN
  Administered 2012-08-01: .8 mg via INTRAVENOUS
  Administered 2012-08-01: 0.2 mg via INTRAVENOUS

## 2012-08-01 MED ORDER — HYDROMORPHONE HCL PF 1 MG/ML IJ SOLN
INTRAMUSCULAR | Status: AC
  Administered 2012-08-01: 1 mg via INTRAVENOUS
  Filled 2012-08-01: qty 1

## 2012-08-01 MED ORDER — ACETAMINOPHEN 10 MG/ML IV SOLN
INTRAVENOUS | Status: AC
  Filled 2012-08-01: qty 100

## 2012-08-01 MED ORDER — MIDAZOLAM HCL 5 MG/5ML IJ SOLN
INTRAMUSCULAR | Status: DC | PRN
  Administered 2012-08-01: 2 mg via INTRAVENOUS

## 2012-08-01 MED ORDER — ALUM & MAG HYDROXIDE-SIMETH 200-200-20 MG/5ML PO SUSP: 30.0000 mL | ORAL | Status: DC | PRN

## 2012-08-01 MED ORDER — HYDROMORPHONE HCL PF 1 MG/ML IJ SOLN
0.5000 mg | INTRAMUSCULAR | Status: DC | PRN
  Administered 2012-08-01: 1 mg via INTRAVENOUS
  Filled 2012-08-01: qty 1

## 2012-08-01 MED ORDER — ONDANSETRON HCL 4 MG/2ML IJ SOLN: 4.0000 mg | Freq: Four times a day (QID) | INTRAMUSCULAR | Status: DC | PRN

## 2012-08-01 MED ORDER — ZOLPIDEM TARTRATE 5 MG PO TABS: 5.0000 mg | ORAL_TABLET | Freq: Every evening | ORAL | Status: DC | PRN

## 2012-08-01 MED ORDER — FENTANYL CITRATE 0.05 MG/ML IJ SOLN
25.0000 ug | INTRAMUSCULAR | Status: DC | PRN
  Administered 2012-08-01 (×3): 25 ug via INTRAVENOUS

## 2012-08-01 MED ORDER — POLYETHYLENE GLYCOL 3350 17 G PO PACK: 17.0000 g | PACK | Freq: Every day | ORAL | Status: DC | PRN

## 2012-08-01 MED ORDER — HYDROMORPHONE HCL PF 1 MG/ML IJ SOLN
INTRAMUSCULAR | Status: DC | PRN
  Administered 2012-08-01 (×4): 0.5 mg via INTRAVENOUS

## 2012-08-01 MED ORDER — ROCURONIUM BROMIDE 100 MG/10ML IV SOLN
INTRAVENOUS | Status: DC | PRN
  Administered 2012-08-01: 10 mg via INTRAVENOUS
  Administered 2012-08-01: 50 mg via INTRAVENOUS

## 2012-08-01 MED ORDER — SODIUM CHLORIDE 0.9 % IV SOLN
INTRAVENOUS | Status: DC
  Administered 2012-08-01 – 2012-08-02 (×2): via INTRAVENOUS
  Filled 2012-08-01 (×5): qty 1000

## 2012-08-01 MED ORDER — HYDROCHLOROTHIAZIDE 25 MG PO TABS
25.0000 mg | ORAL_TABLET | Freq: Every day | ORAL | Status: DC
  Administered 2012-08-02: 25 mg via ORAL
  Filled 2012-08-01: qty 1

## 2012-08-01 MED ORDER — HYDROCODONE-ACETAMINOPHEN 7.5-325 MG PO TABS
1.0000 | ORAL_TABLET | ORAL | Status: DC
  Administered 2012-08-01 – 2012-08-02 (×6): 2 via ORAL
  Filled 2012-08-01 (×6): qty 2

## 2012-08-01 SURGICAL SUPPLY — 76 items
ADAPTER BOLT FEMORAL +2/-2 (Knees) ×1 IMPLANT
ADH SKN CLS APL DERMABOND .7 (GAUZE/BANDAGES/DRESSINGS) ×1
ADPR FEM +2/-2 OFST BOLT (Knees) ×1 IMPLANT
ADPR FEM 5D STRL KN PFC SGM (Orthopedic Implant) ×1 IMPLANT
BAG SPEC THK2 15X12 ZIP CLS (MISCELLANEOUS) ×1
BAG ZIPLOCK 12X15 (MISCELLANEOUS) ×2 IMPLANT
BANDAGE ELASTIC 6 VELCRO ST LF (GAUZE/BANDAGES/DRESSINGS) ×2 IMPLANT
BANDAGE ESMARK 6X9 LF (GAUZE/BANDAGES/DRESSINGS) ×1 IMPLANT
BLADE SAW SGTL 13.0X1.19X90.0M (BLADE) ×2 IMPLANT
BNDG CMPR 9X6 STRL LF SNTH (GAUZE/BANDAGES/DRESSINGS) ×1
BNDG ESMARK 6X9 LF (GAUZE/BANDAGES/DRESSINGS) ×2
BONE CEMENT GENTAMICIN (Cement) ×6 IMPLANT
BOWL SMART MIX CTS (DISPOSABLE) ×2 IMPLANT
CAP UPCHARGE REVISION TRAY ×1 IMPLANT
CEMENT BONE GENTAMICIN 40 (Cement) IMPLANT
CLOTH BEACON ORANGE TIMEOUT ST (SAFETY) ×2 IMPLANT
CUFF TOURN SGL QUICK 34 (TOURNIQUET CUFF) ×2
CUFF TRNQT CYL 34X4X40X1 (TOURNIQUET CUFF) ×1 IMPLANT
DECANTER SPIKE VIAL GLASS SM (MISCELLANEOUS) ×1 IMPLANT
DERMABOND ADVANCED (GAUZE/BANDAGES/DRESSINGS) ×1
DERMABOND ADVANCED .7 DNX12 (GAUZE/BANDAGES/DRESSINGS) ×1 IMPLANT
DRAPE EXTREMITY T 121X128X90 (DRAPE) ×2 IMPLANT
DRAPE POUCH INSTRU U-SHP 10X18 (DRAPES) ×2 IMPLANT
DRAPE U-SHAPE 47X51 STRL (DRAPES) ×2 IMPLANT
DRSG AQUACEL AG ADV 3.5X10 (GAUZE/BANDAGES/DRESSINGS) ×2 IMPLANT
DRSG AQUACEL AG ADV 3.5X14 (GAUZE/BANDAGES/DRESSINGS) ×1 IMPLANT
DRSG TEGADERM 4X4.75 (GAUZE/BANDAGES/DRESSINGS) ×2 IMPLANT
DURAPREP 26ML APPLICATOR (WOUND CARE) ×2 IMPLANT
ELECT REM PT RETURN 9FT ADLT (ELECTROSURGICAL) ×2
ELECTRODE REM PT RTRN 9FT ADLT (ELECTROSURGICAL) ×1 IMPLANT
EVACUATOR 1/8 PVC DRAIN (DRAIN) ×2 IMPLANT
FACESHIELD LNG OPTICON STERILE (SAFETY) ×12 IMPLANT
FEMORAL ADAPTER (Orthopedic Implant) ×1 IMPLANT
GAUZE SPONGE 2X2 8PLY STRL LF (GAUZE/BANDAGES/DRESSINGS) ×1 IMPLANT
GLOVE BIO SURGEON STRL SZ 6.5 (GLOVE) ×1 IMPLANT
GLOVE BIOGEL PI IND STRL 6.5 (GLOVE) IMPLANT
GLOVE BIOGEL PI IND STRL 7.5 (GLOVE) ×1 IMPLANT
GLOVE BIOGEL PI IND STRL 8 (GLOVE) ×1 IMPLANT
GLOVE BIOGEL PI INDICATOR 6.5 (GLOVE) ×2
GLOVE BIOGEL PI INDICATOR 7.5 (GLOVE) ×1
GLOVE BIOGEL PI INDICATOR 8 (GLOVE) ×1
GLOVE ECLIPSE 8.0 STRL XLNG CF (GLOVE) ×2 IMPLANT
GLOVE INDICATOR 6.5 STRL GRN (GLOVE) ×1 IMPLANT
GLOVE ORTHO TXT STRL SZ7.5 (GLOVE) ×4 IMPLANT
GLOVE SURG SS PI 6.5 STRL IVOR (GLOVE) ×2 IMPLANT
GOWN BRE IMP PREV XXLGXLNG (GOWN DISPOSABLE) ×3 IMPLANT
GOWN BRE IMP SLV SIRUS LXLNG (GOWN DISPOSABLE) ×1 IMPLANT
GOWN STRL NON-REIN LRG LVL3 (GOWN DISPOSABLE) ×4 IMPLANT
HANDPIECE INTERPULSE COAX TIP (DISPOSABLE) ×2
IMMOBILIZER KNEE 20 (SOFTGOODS) ×4
IMMOBILIZER KNEE 20 THIGH 36 (SOFTGOODS) IMPLANT
KIT BASIN OR (CUSTOM PROCEDURE TRAY) ×2 IMPLANT
MANIFOLD NEPTUNE II (INSTRUMENTS) ×2 IMPLANT
NDL SAFETY ECLIPSE 18X1.5 (NEEDLE) ×1 IMPLANT
NEEDLE HYPO 18GX1.5 SHARP (NEEDLE) ×2
NS IRRIG 1000ML POUR BTL (IV SOLUTION) ×3 IMPLANT
PACK TOTAL JOINT (CUSTOM PROCEDURE TRAY) ×2 IMPLANT
POSITIONER SURGICAL ARM (MISCELLANEOUS) ×2 IMPLANT
SET HNDPC FAN SPRY TIP SCT (DISPOSABLE) ×1 IMPLANT
SET PAD KNEE POSITIONER (MISCELLANEOUS) ×2 IMPLANT
SLEEVE FEM UNIV DIST PRO SZ 20 (Sleeve) ×1 IMPLANT
SPONGE GAUZE 2X2 STER 10/PKG (GAUZE/BANDAGES/DRESSINGS) ×1
SPONGE LAP 18X18 X RAY DECT (DISPOSABLE) ×1 IMPLANT
STEM  REV  115X14 (Stem) ×1 IMPLANT
STEM REV 115X14 (Stem) IMPLANT
SUCTION FRAZIER 12FR DISP (SUCTIONS) ×2 IMPLANT
SUT MNCRL AB 4-0 PS2 18 (SUTURE) ×2 IMPLANT
SUT VIC AB 1 CT1 36 (SUTURE) ×4 IMPLANT
SUT VIC AB 2-0 CT1 27 (SUTURE) ×4
SUT VIC AB 2-0 CT1 TAPERPNT 27 (SUTURE) ×3 IMPLANT
SUT VLOC 180 0 24IN GS25 (SUTURE) ×1 IMPLANT
SYR 50ML LL SCALE MARK (SYRINGE) ×2 IMPLANT
TOWEL OR 17X26 10 PK STRL BLUE (TOWEL DISPOSABLE) ×4 IMPLANT
TRAY FOLEY CATH 14FRSI W/METER (CATHETERS) ×2 IMPLANT
WATER STERILE IRR 1500ML POUR (IV SOLUTION) ×3 IMPLANT
WRAP KNEE MAXI GEL POST OP (GAUZE/BANDAGES/DRESSINGS) ×2 IMPLANT

## 2012-08-01 NOTE — Anesthesia Preprocedure Evaluation (Addendum)
Anesthesia Evaluation  Patient identified by MRN, date of birth, ID band Patient awake  General Assessment Comment:Past Medical History   Diagnosis  Date   .  Hypertension     .  Depression     .  Arthritis     .  Stroke  2007       WEAKNESS RIGHT SIDE-PT ON PLAVIX     Reviewed: Allergy & Precautions, H&P , NPO status , Patient's Chart, lab work & pertinent test results, reviewed documented beta blocker date and time   History of Anesthesia Complications (+) AWARENESS UNDER ANESTHESIA  Airway Mallampati: II TM Distance: >3 FB Neck ROM: full    Dental No notable dental hx.    Pulmonary neg pulmonary ROS,  breath sounds clear to auscultation  Pulmonary exam normal       Cardiovascular Exercise Tolerance: Good hypertension, On Medications and On Home Beta Blockers Rhythm:regular Rate:Normal     Neuro/Psych PSYCHIATRIC DISORDERS Depression Weakness right side from CVA CVA, Residual Symptoms negative psych ROS   GI/Hepatic negative GI ROS, Neg liver ROS,   Endo/Other  negative endocrine ROS  Renal/GU negative Renal ROS  negative genitourinary   Musculoskeletal   Abdominal   Peds  Hematology negative hematology ROS (+)   Anesthesia Other Findings   Reproductive/Obstetrics negative OB ROS                         Anesthesia Physical Anesthesia Plan  ASA: III  Anesthesia Plan: General   Post-op Pain Management:    Induction: Intravenous  Airway Management Planned: Oral ETT  Additional Equipment:   Intra-op Plan:   Post-operative Plan:   Informed Consent: I have reviewed the patients History and Physical, chart, labs and discussed the procedure including the risks, benefits and alternatives for the proposed anesthesia with the patient or authorized representative who has indicated his/her understanding and acceptance.   Dental Advisory Given  Plan Discussed with: CRNA  Anesthesia  Plan Comments: (Discussed risks of femoral nerve block including failure, bleeding, infection, nerve damage.  Femoral nerve block does not usually prevent all pain. Specifically, it treats the anterior, but often not the posterior knee. Questions answered.  Patient consents to block. On plavix, last dose five days ago. Plan general with femoral nerve block. Tolerated GA fine on 06/20/12)      Anesthesia Quick Evaluation

## 2012-08-01 NOTE — Transfer of Care (Signed)
Immediate Anesthesia Transfer of Care Note  Patient: Marcus Klein  Procedure(s) Performed: Procedure(s) (LRB) with comments: TOTAL KNEE ARTHROPLASTY (Right)  Patient Location: PACU  Anesthesia Type:General  Level of Consciousness: awake, alert , oriented and patient cooperative  Airway & Oxygen Therapy: Patient Spontanous Breathing and Patient connected to face mask oxygen  Post-op Assessment: Report given to PACU RN, Post -op Vital signs reviewed and stable and Patient moving all extremities  Post vital signs: Reviewed and stable  Complications: No apparent anesthesia complications

## 2012-08-01 NOTE — Progress Notes (Signed)
Utilization review completed.  

## 2012-08-01 NOTE — Preoperative (Signed)
Beta Blockers   Reason not to administer Beta Blockers:Metoprolol taken at 0630 at 08-01-12

## 2012-08-01 NOTE — Interval H&P Note (Signed)
History and Physical Interval Note:  08/01/2012 9:27 AM  Marcus Klein  has presented today for surgery, with the diagnosis of Right Knee Tramatic Osteoarthritis  The various methods of treatment have been discussed with the patient and family. After consideration of risks, benefits and other options for treatment, the patient has consented to  Procedure(s) (LRB) with comments: TOTAL KNEE ARTHROPLASTY (Right) plus removal of deep implants, retained cortical screws as a surgical intervention .  The patient's history has been reviewed, patient examined, no change in status, stable for surgery.  I have reviewed the patient's chart and labs.  Questions were answered to the patient's satisfaction.     [name]

## 2012-08-01 NOTE — Anesthesia Procedure Notes (Signed)
Anesthesia Regional Block:  Femoral nerve block  Pre-Anesthetic Checklist: ,, timeout performed, Correct Patient, Correct Site, Correct Laterality, Correct Procedure, Correct Position, site marked, Risks and benefits discussed,  Surgical consent,  Pre-op evaluation,  At surgeon's request and post-op pain management  Laterality: Right  Prep: chloraprep       Needles:  Injection technique: Single-shot  Needle Type: Stimiplex      Needle Gauge: 21 G    Additional Needles:  Procedures: Doppler guided, ultrasound guided (picture in chart) and nerve stimulator Femoral nerve block  Nerve Stimulator or Paresthesia:  Response: right quadriceps, 0.5 mA,   Additional Responses:   Narrative:  Start time: 08/01/2012 10:20 AM  Performed by: Personally  Anesthesiologist: Nickalos Petersen  Additional Notes: No pain on injection. No increased resistance to injection. Motor intact immediately after block. Loss of quadriceps strength at 20 minutes.

## 2012-08-02 ENCOUNTER — Encounter (HOSPITAL_COMMUNITY): Payer: Self-pay | Admitting: Orthopedic Surgery

## 2012-08-02 DIAGNOSIS — E871 Hypo-osmolality and hyponatremia: Secondary | ICD-10-CM | POA: Diagnosis not present

## 2012-08-02 DIAGNOSIS — D500 Iron deficiency anemia secondary to blood loss (chronic): Secondary | ICD-10-CM | POA: Diagnosis not present

## 2012-08-02 DIAGNOSIS — E669 Obesity, unspecified: Secondary | ICD-10-CM | POA: Diagnosis present

## 2012-08-02 LAB — CBC
HCT: 32.3 % — ABNORMAL LOW (ref 39.0–52.0)
Hemoglobin: 10.9 g/dL — ABNORMAL LOW (ref 13.0–17.0)
MCH: 29.6 pg (ref 26.0–34.0)
MCV: 87.8 fL (ref 78.0–100.0)
RBC: 3.68 MIL/uL — ABNORMAL LOW (ref 4.22–5.81)

## 2012-08-02 LAB — BASIC METABOLIC PANEL
CO2: 27 mEq/L (ref 19–32)
Calcium: 9.2 mg/dL (ref 8.4–10.5)
Creatinine, Ser: 0.92 mg/dL (ref 0.50–1.35)
Glucose, Bld: 235 mg/dL — ABNORMAL HIGH (ref 70–99)
Sodium: 134 mEq/L — ABNORMAL LOW (ref 135–145)

## 2012-08-02 MED ORDER — FERROUS SULFATE 325 (65 FE) MG PO TABS: 325.0000 mg | ORAL_TABLET | Freq: Three times a day (TID) | ORAL | Status: DC

## 2012-08-02 MED ORDER — METHOCARBAMOL 500 MG PO TABS: 500.0000 mg | ORAL_TABLET | Freq: Four times a day (QID) | ORAL | Status: DC | PRN

## 2012-08-02 MED ORDER — DSS 100 MG PO CAPS: 100.0000 mg | ORAL_CAPSULE | Freq: Two times a day (BID) | ORAL | Status: DC

## 2012-08-02 MED ORDER — POLYETHYLENE GLYCOL 3350 17 G PO PACK: 17.0000 g | PACK | Freq: Every day | ORAL | Status: DC | PRN

## 2012-08-02 MED ORDER — HYDROCODONE-ACETAMINOPHEN 7.5-325 MG PO TABS: 1.0000 | ORAL_TABLET | ORAL | Status: DC | PRN

## 2012-08-02 NOTE — Anesthesia Postprocedure Evaluation (Signed)
  Anesthesia Post-op Note  Patient: Marcus Klein  Procedure(s) Performed: Procedure(s) (LRB): TOTAL KNEE ARTHROPLASTY (Right)  Patient Location: PACU  Anesthesia Type: General  Level of Consciousness: awake and alert   Airway and Oxygen Therapy: Patient Spontanous Breathing  Post-op Pain: mild  Post-op Assessment: Post-op Vital signs reviewed, Patient's Cardiovascular Status Stable, Respiratory Function Stable, Patent Airway and No signs of Nausea or vomiting  Last Vitals:  Filed Vitals:   08/02/12 0636  BP: 126/67  Pulse: 91  Temp: 36.8 C  Resp: 16    Post-op Vital Signs: stable   Complications: No apparent anesthesia complications

## 2012-08-02 NOTE — Progress Notes (Signed)
OT Note:  Pt does not feel he needs OT.  He has had previous knee sx, has DME and assist at home.  [place], OTR/L [phone] 08/02/2012

## 2012-08-02 NOTE — Progress Notes (Signed)
   Subjective: 1 Day Post-Op Procedure(s) (LRB): TOTAL KNEE ARTHROPLASTY (Right)   Patient reports pain as mild, pain well controlled. No events throughout the night. Ready to be discharged home if he does well with PT.   Objective:   VITALS:   Filed Vitals:   08/02/12 0636  BP: 126/67  Pulse: 91  Temp: 98.2 F (36.8 C)  Resp: 16    Neurovascular intact Dorsiflexion/Plantar flexion intact Incision: dressing C/D/I No cellulitis present Compartment soft  LABS  Basename 08/02/12 0510  HGB 10.9*  HCT 32.3*  WBC 9.7  PLT 225     Basename 08/02/12 0510  NA 134*  K 4.3  BUN 14  CREATININE 0.92  GLUCOSE 235*     Assessment/Plan: 1 Day Post-Op Procedure(s) (LRB): TOTAL KNEE ARTHROPLASTY (Right) HV drain d/c'ed Foley cath d/c'ed Advance diet Up with therapy D/C IV fluids Discharge home with home health after PT. Follow up in 2 weeks at [facility]. Follow up with OLIN,Hamlin Devine D in 2 weeks.  Contact information:  [facility] [address], [address] [place] [address] [phone]    Expected ABLA  Treated with iron and will observe  Obese (BMI 30-39.9)  Estimated Body mass index is 34.44 kg/(m^2) as calculated from the following:   Height as of this encounter: 5\' 10" (1.778 m).   Weight as of this encounter: 240 lb(108.863 kg). Patient also counseled that weight may inhibit the healing process Patient counseled that losing weight will help with future health issues  Hyponatremia Treated with IV fluids and will observe       Anastasio Auerbach. Seichi Kaufhold   PAC  08/02/2012, 8:23 AM

## 2012-08-02 NOTE — Care Management Note (Signed)
    Page 1 of 1   08/02/2012     5:50:31 PM   CARE MANAGEMENT NOTE 08/02/2012  Patient:  Marcus Klein, Marcus Klein   Account Number:  [id number]  Date Initiated:  08/02/2012  Documentation initiated by:  Colleen Can  Subjective/Objective Assessment:   DX rt knee osteoarthritis     Action/Plan:   home with [facility] services. Genevieve Norlander will start [facility] services 08/03/2012   Anticipated DC Date:  08/02/2012   Anticipated DC Plan:  HOME W HOME HEALTH SERVICES  In-house referral  NA      DC Planning Services  CM consult      [facility] Choice  HOME HEALTH   Choice offered to / List presented to:          [facility] arranged  HH-2 PT      [facility] agency  [facility]   Status of service:  Completed, signed off Medicare Important Message given?   (If response is "NO", the following Medicare IM given date fields will be blank) Date Medicare IM given:   Date Additional Medicare IM given:    Discharge Disposition:  HOME W HOME HEALTH SERVICES  Per UR Regulation:    If discussed at Long Length of Stay Meetings, dates discussed:    Comments:

## 2012-08-02 NOTE — Progress Notes (Signed)
Pt noted to have bloody drainage  rt knee surgical dsg. M. Babish, PA in room to see drainage.  Surgical dsg changed and clean dry dsg applied.  primary RN notified.  Will continue to minitor.

## 2012-08-02 NOTE — Brief Op Note (Signed)
08/01/2012  7:47 PM  PATIENT:  Marcus Klein  51 y.o. male  PRE-OPERATIVE DIAGNOSIS:  Right Knee post-traumatic Osteoarthritis  POST-OPERATIVE DIAGNOSIS:  Right knee post-traumatic osteoarthritis  PROCEDURE:  Procedure(s) (LRB) with comments: TOTAL KNEE ARTHROPLASTY (Right)  Depuy total knee system - see dictated operative note SURGEON:  Surgeon(s) and Role:    * [name], MD - Primary  PHYSICIAN ASSISTANT: Leilani Able, PA-C   ANESTHESIA:   regional and general  EBL:     BLOOD ADMINISTERED:none  DRAINS: (1 medium) Hemovact drain(s) in the right knee with  Suction Open   LOCAL MEDICATIONS USED:  None  SPECIMEN:  No Specimen  DISPOSITION OF SPECIMEN:  N/A  COUNTS:  YES  TOURNIQUET:   Total Tourniquet Time Documented: Thigh (Right) - 89 minutes  DICTATION: .Other Dictation: Dictation Number [id number]  PLAN OF CARE: Admit to inpatient   PATIENT DISPOSITION:  PACU - hemodynamically stable.   Delay start of Pharmacological VTE agent (>24hrs) due to surgical blood loss or risk of bleeding: no

## 2012-08-02 NOTE — Progress Notes (Signed)
Physical Therapy Treatment Patient Details Name: Marcus Klein MRN: [id number] DOB: 01-07-1961 Today's Date: 08/02/2012 Time: 1341-1410 PT Time Calculation (min): 29 min  PT Assessment / Plan / Recommendation Comments on Treatment Session  Reviewed don/doff KI and home ther ex with pt and mother    Follow Up Recommendations  Home health PT     Does the patient have the potential to tolerate intense rehabilitation     Barriers to Discharge        Equipment Recommendations  None recommended by PT    Recommendations for Other Services OT consult  Frequency 7X/week   Plan Discharge plan remains appropriate    Precautions / Restrictions Precautions Precautions: Knee Required Braces or Orthoses: Knee Immobilizer - Right Knee Immobilizer - Right: Discontinue once straight leg raise with < 10 degree lag Restrictions Weight Bearing Restrictions: No Other Position/Activity Restrictions: WBAT   Pertinent Vitals/Pain 6/10; premedicated    Mobility  Transfers Transfers: Sit to Stand;Stand to Sit Sit to Stand: 4: Min guard Stand to Sit: 4: Min guard Details for Transfer Assistance: cues for use of UEs and for LE management Ambulation/Gait Ambulation/Gait Assistance: 4: Min guard;5: Supervision Ambulation Distance (Feet): 50 Feet Assistive device: Rolling walker Ambulation/Gait Assistance Details: cues for posture and position from RW; marked improvement in pacing and noted decrease in R toes catching floor on swing phase Gait Pattern: Step-to pattern    Exercises Total Joint Exercises Ankle Circles/Pumps: AROM;10 reps;AAROM;Supine;Both Quad Sets: AROM;AAROM;Both;10 reps;Supine Heel Slides: AAROM;10 reps;Supine;Left Straight Leg Raises: AAROM;10 reps;Supine;Left   PT Diagnosis:    PT Problem List:   PT Treatment Interventions:     PT Goals Acute Rehab PT Goals PT Goal Formulation: With patient Time For Goal Achievement: 08/05/12 Potential to Achieve Goals: Good Pt  will go Supine/Side to Sit: with supervision PT Goal: Supine/Side to Sit - Progress: Goal set today Pt will go Sit to Supine/Side: with supervision PT Goal: Sit to Supine/Side - Progress: Goal set today Pt will go Sit to Stand: with supervision PT Goal: Sit to Stand - Progress: Progressing toward goal Pt will go Stand to Sit: with supervision PT Goal: Stand to Sit - Progress: Progressing toward goal Pt will Ambulate: 51 - 150 feet;with supervision;with rolling walker PT Goal: Ambulate - Progress: Progressing toward goal  Visit Information  Last PT Received On: 08/02/12 Assistance Needed: +1    Subjective Data  Subjective: I think its hurting more than this morning Patient Stated Goal: Resume previous lifestyle with decreased pain   Cognition  Overall Cognitive Status: Appears within functional limits for tasks assessed/performed Arousal/Alertness: Awake/alert Orientation Level: Appears intact for tasks assessed Behavior During Session: [facility] for tasks performed Cognition - Other Comments: Mildly impulsive    Balance     End of Session PT - End of Session Equipment Utilized During Treatment: Right knee immobilizer Activity Tolerance: Patient tolerated treatment well;Patient limited by fatigue;Patient limited by pain Patient left: in chair;with call bell/phone within reach;with family/visitor present Nurse Communication: Mobility status   GP     Marcus Klein 08/02/2012, 2:13 PM

## 2012-08-02 NOTE — Evaluation (Signed)
Physical Therapy Evaluation Patient Details Name: Marcus Klein MRN: [id number] DOB: 06-05-1961 Today's Date: 08/02/2012 Time: 0910-0940 PT Time Calculation (min): 30 min  PT Assessment / Plan / Recommendation Clinical Impression  Pt s/p R TKR presents with decreased R LE strength/ROM, pain and premorbid R side weakness limiting functional mobility    PT Assessment  Patient needs continued PT services    Follow Up Recommendations  Home health PT    Does the patient have the potential to tolerate intense rehabilitation      Barriers to Discharge        Equipment Recommendations  None recommended by PT    Recommendations for Other Services OT consult   Frequency 7X/week    Precautions / Restrictions Precautions Precautions: Knee Required Braces or Orthoses: Knee Immobilizer - Right Knee Immobilizer - Right: Discontinue once straight leg raise with < 10 degree lag Restrictions Weight Bearing Restrictions: No Other Position/Activity Restrictions: WBAT   Pertinent Vitals/Pain 6-7/10; premedicated, cold packs provided      Mobility  Bed Mobility Bed Mobility: Supine to Sit Supine to Sit: 4: Min assist Details for Bed Mobility Assistance: assist with R LE Transfers Transfers: Sit to Stand;Stand to Sit Sit to Stand: 4: Min assist Stand to Sit: 4: Min assist Details for Transfer Assistance: assist for balance and cues for use of UEs to self assist and for LE management Ambulation/Gait Ambulation/Gait Assistance: 4: Min assist;3: Mod assist Ambulation Distance (Feet): 48 Feet Assistive device: Rolling walker Ambulation/Gait Assistance Details: cues for sequence, stride length, posture, postion from RW and to decrease pace.  Pt noted to catch R foot on floor secondary to drop foot Gait Pattern: Step-to pattern    Shoulder Instructions     Exercises     PT Diagnosis: Difficulty walking  PT Problem List: Decreased strength;Decreased range of motion;Decreased  activity tolerance;Decreased mobility;Decreased knowledge of use of DME;Impaired tone;Pain;Decreased knowledge of precautions PT Treatment Interventions: DME instruction;Gait training;Stair training;Functional mobility training;Therapeutic activities;Therapeutic exercise;Patient/family education   PT Goals Acute Rehab PT Goals PT Goal Formulation: With patient Time For Goal Achievement: 08/05/12 Potential to Achieve Goals: Good Pt will go Supine/Side to Sit: with supervision PT Goal: Supine/Side to Sit - Progress: Goal set today Pt will go Sit to Supine/Side: with supervision PT Goal: Sit to Supine/Side - Progress: Goal set today Pt will go Sit to Stand: with supervision PT Goal: Sit to Stand - Progress: Goal set today Pt will go Stand to Sit: with supervision PT Goal: Stand to Sit - Progress: Goal set today Pt will Ambulate: 51 - 150 feet;with supervision;with rolling walker PT Goal: Ambulate - Progress: Goal set today  Visit Information  Last PT Received On: 08/02/12 Assistance Needed: +1    Subjective Data  Subjective: I haven't been able to straighten this knee for years - the muscle on the back is just too strong to let it go Patient Stated Goal: Resume previous lifestyle with decreased pain   Prior Functioning  Home Living Lives With: Family Available Help at Discharge: Family Type of Home: House Home Access: Ramped entrance Home Layout: Two level Additional Comments: chair lift on stairs to second level Prior Function Level of Independence: Independent Able to Take Stairs?: No Vocation: On disability Communication Communication: No difficulties Dominant Hand: Left    Cognition  Overall Cognitive Status: Appears within functional limits for tasks assessed/performed Arousal/Alertness: Awake/alert Orientation Level: Appears intact for tasks assessed Behavior During Session: [facility] for tasks performed Cognition - Other Comments: Mildly impulsive  Extremity/Trunk  Assessment Right Upper Extremity Assessment RUE ROM/Strength/Tone: Deficits RUE ROM/Strength/Tone Deficits: decreased strength most notably wrist and hand  RUE Coordination: Deficits Left Upper Extremity Assessment LUE ROM/Strength/Tone: WFL for tasks assessed Right Lower Extremity Assessment RLE ROM/Strength/Tone: Deficits RLE ROM/Strength/Tone Deficits: -15 -20 ext; flex not assessed this am 2* bleeding at surgical site; noted only min DF activity with pt admitting to drop foot x many years Left Lower Extremity Assessment LLE ROM/Strength/Tone: [facility] for tasks assessed   Balance    End of Session PT - End of Session Equipment Utilized During Treatment: Gait belt;Right knee immobilizer Activity Tolerance: Patient limited by fatigue;Patient limited by pain Patient left: in chair;with call bell/phone within reach;with family/visitor present Nurse Communication: Mobility status  GP     Beck Cofer 08/02/2012, 12:15 PM

## 2012-08-03 NOTE — Op Note (Signed)
NAMECONG, HIGHTOWER NO.:  [id number]  MEDICAL RECORD NO.:  [id number]  LOCATION:  1602                         FACILITY:  [facility]  PHYSICIAN:  Madlyn Frankel. Charlann Boxer, M.D.  DATE OF BIRTH:  01-31-1961  DATE OF PROCEDURE: DATE OF DISCHARGE:  08/02/2012                              OPERATIVE REPORT   PREOPERATIVE DIAGNOSE:  Posttraumatic osteoarthritis of the right knee with slight distal femoral malunion status post removal of the majority of hardware in the right knee 6 weeks prior.  POSTOPERATIVE DIAGNOSES: 1. Posttraumatic osteoarthritis of the right knee with slight distal     femoral malunion status post removal of the majority of hardware in     the right knee 6 weeks prior. 2. Retained distal femoral hardware.  PROCEDURES: 1. Removal of deep implants including 3 distal femoral screws. 2. Right total knee replacement.  COMPONENTS USED:  DePuy total knee system requiring the utilization of revision knee system components based on the distal femoral deformity. I used a size 4 narrow posterior stabilized femur with the +2 bolt 5 degree adapter size 20 mm cemented sleeve and a size 14 x 115 mm press- fit stem.  The tibia size was a size 4 MBT revision tray and a size 10 mm posterior stabilized insert and 41 patellar buttons.  SURGEON:  Madlyn Frankel. Charlann Boxer, M.D.  ASSISTANT:  Jaquelyn Bitter. Ernestene Kiel., note that Mr. Chabon was present for the entirety of the case utilized from perioperative positioning, maintenance of the upper extremity, general facilitation of the case as well as primary wound closure.  ANESTHESIA:  Preoperative regional femoral nerve block plus general anesthetic.  SPECIMENS:  None.  DRAINS:  One medium Hemovac.  TOURNIQUET TIME:  88 minutes at 250 mmHg.  COMPLICATION:  None.  INDICATION FOR PROCEDURE:  Mr. Genis is a 51 year old male who had been seen and evaluated in the office as a second opinion evaluation of right knee  osteoarthritis with retained hardware.  After reviewing with him his current situation, he underwent about 6 weeks ago removal of his lateral femoral plate and screws in preparation for this procedure to make certain there is no complication infection, and he would have adequate healing laterally.  Once this was confirmed, surgery was scheduled for right total knee replacement.  Risks of infection, DVT, component failure, need for future revision surgery, all of the situation of a non-standard complicated posttraumatic arthritic condition with malunion of the distal femur.  Consent was obtained for the benefit of pain relief.  PROCEDURE IN DETAIL:  Patient was brought to operative theater.  Once adequate anesthesia, preoperative antibiotics, Ancef administered, he was positioned supine with the right thigh tourniquet placed.  The right lower extremity was then prepped and draped in sterile fashion.  Time- out was performed identifying the patient, planned procedure, and extremity.  Right lower extremity is exsanguinated, tourniquet elevated to 250 mmHg. A midline incision was used followed by the creation of soft tissue planes.  A median arthrotomy was made.  The first portion of the case at this point was to remove and debride scarring of the knee.  There was no obvious concern for infection.  Once the  knee was exposed, I was able to uncover and find the previously placed 3.5-mm cortical screws and these were all removed.  At this point, attention was first directed to patella, precut mesh was noted to be about 24 mm.  I resected down to about 14 mm using a 41 patellar button.  Patient's bone was noted to be very osteopenic.  Metal Shim was placed to protect the patella from retractors and saw blades.  Attention was now directed to femur, evaluated his radiographs as we were doing this, and the intramedullary canal was opened with a drill and then a canal finder passed by hand.   I was able to seal the canal of the more proximal isthmus of the femur, as I passed this intramedullary rod further into the shaft of the femur.  I then reamed up to a 13-mm reamer for the depth of 115 mm stem.  With a stem in place, the distal femoral cutting block was placed in 5 degrees of valgus, 10 mm of bone was resected off the distal femur. Following this resection, I prepared the proximal femur for a 20-mm cemented sleeve with the idea to provide more internal fixation to the very osteopenic distal femur as well as to get to the segment of the metaphyseal fracture.  Once this was prepared and drilled to the appropriate depth, I now attended to the tibia.  The tibia was subluxated anteriorly retaining the sleeve and the femur for protection to prevent from any complications.  With the tibia subluxated anteriorly, an extramedullary guide was positioned and I resected 10-mm bone and based off the proximal lateral tibia.  This cut was perpendicular shaft to the tibia and confirmed using a alignment rod.  I chose to use an MBT revision tray due to the osteopenic nature even of the proximal tibia.  I made certain that the cut had 2 degrees of posterior slope.  The proximal tibia was then prepared by drilling for the MBT revision tray and the MBT trial revision placed in the tibia.  At this point, attention was returned back to the femur.  The 4-in-1 cutting block was then positioned and set based off the proximal tibial cut with the rotation block pinned parallel to the proximal tibial tray. The anterior and posterior chamfer cuts were then made for a size 4 femur.  The box cut was made for a posterior stabilized insert.  At this point, a trial reduction was carried out with the 115-mm stem and a 20- mm sleeve and the +2 adapter on a 5-degree bolt.  With the femur and the tibial components in place, the size 10 insert was placed and the knee came to full extension with the patella  tracking through the trochlea without application of significant pressure.  At this point, all trial components were removed.  The knee and bone segments were copiously irrigated with normal saline solution.  Please note that with the trial components in place prior to removal, I did take an intraoperative photo and an intraoperative radiograph to make certain that the stem had bypassed this fracture area without complication.  The final components were opened and the components configured on the back table.  I placed a bone plug into the proximal tibia, tacked to the cement restrictor.  The final components were then cemented into position.  I did use a gentamicin impregnated cement.  The tibial component was cemented first followed by the femoral component, cemented in the sleeve leaving the press-fit stem  alone.  The knee was brought to extension with a 10-mm insert and the knee was kept in extension until the cement fully cured.  Once the cement fully cured, excess cement was removed throughout the knee.  We irrigated the knee and again tourniquet had been let down after 88 minutes.  The final 10-mm posterior stabilized insert was in place.  At this point, with the knee in flexion, a medium Hemovac drain was placed deep in place in lateral gutter.  The extensor mechanism was then reapproximated using #1 Vicryl in combination with the V-lock suture.  The remainder of wound was closed with 2-0 Vicryl and staples on the skin.  The skin was cleaned, dried, and dressed sterilely using an Aquacel dressing.  Drain site was dressed separately.  The patient was then brought to recovery room in stable condition and tolerated the procedure well.  We will have him weight bear as tolerated.  He will work on range of motion of the knee and follow up in 2 weeks to assess his functional range of motion.     Madlyn Frankel Charlann Boxer, M.D.     MDO/MEDQ  D:  08/02/2012  T:  08/03/2012  Job:   [id number]
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
## 2014-06-05 IMAGING — DX DG KNEE 1-2V PORT*R*
1 series · 1 of 1 positions shown · non-contrast
Comparison: Prior intraoperative radiographs dated 06/20/2012,
prior MRI of the knee 02/29/2012

CLINICAL DATA: Hardware removal, right knee arthroplasty

PORTABLE RIGHT KNEE - 1-2 VIEW

[AP]
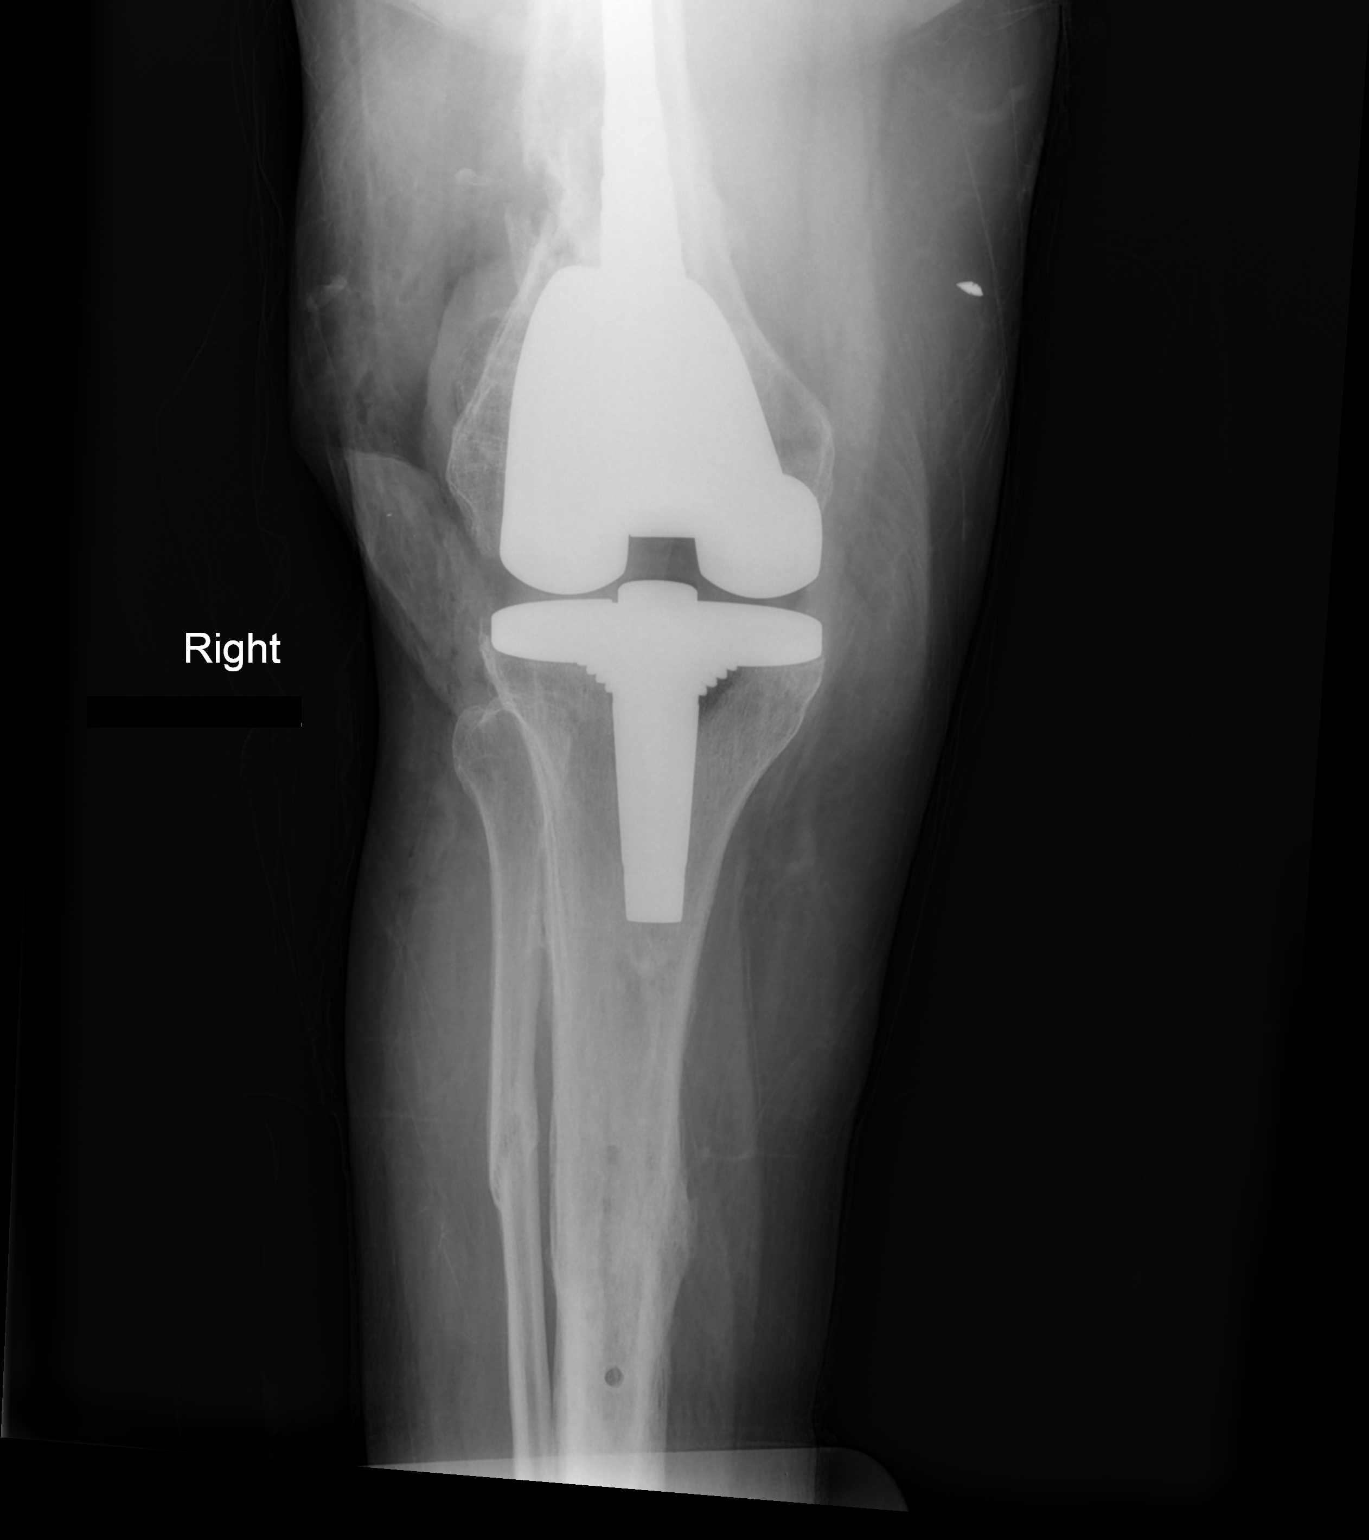

[1 of 1 positions shown; findings below may reference images not displayed]

FINDINGS: Interval surgical changes including removal of three
screws from the distal femoral metaphysis and total knee
arthroplasty with long stem femoral component.  Remote healed
fractures of the mid tibial and fibular diaphyses again noted.
There is a 7 mm metallic foreign body in the soft tissues medial to
the distal femur.  Irregular sclerosis of the distal femoral
metaphysis and diaphysis appear similar to prior.
IMPRESSION: 1.  Interval removal of distal femoral screws and additional
surgical changes of total knee arthroplasty with long stem femoral
component without evidence of immediate hardware complication.
2.  Healed fractures of the mid tibial and fibular diaphyses
3.  A 7 mm metallic foreign body within the soft tissues medial to
the distal femur is unchanged from prior MRI.

## 2014-09-17 DIAGNOSIS — Z6833 Body mass index (BMI) 33.0-33.9, adult: Secondary | ICD-10-CM | POA: Diagnosis not present

## 2014-09-17 DIAGNOSIS — N951 Menopausal and female climacteric states: Secondary | ICD-10-CM | POA: Diagnosis not present

## 2014-09-17 DIAGNOSIS — D3500 Benign neoplasm of unspecified adrenal gland: Secondary | ICD-10-CM | POA: Diagnosis not present

## 2014-09-17 DIAGNOSIS — R232 Flushing: Secondary | ICD-10-CM | POA: Diagnosis not present

## 2014-09-19 DIAGNOSIS — R232 Flushing: Secondary | ICD-10-CM | POA: Diagnosis not present

## 2014-09-19 DIAGNOSIS — D3500 Benign neoplasm of unspecified adrenal gland: Secondary | ICD-10-CM | POA: Diagnosis not present

## 2014-10-02 DIAGNOSIS — Z6833 Body mass index (BMI) 33.0-33.9, adult: Secondary | ICD-10-CM | POA: Diagnosis not present

## 2014-10-02 DIAGNOSIS — F321 Major depressive disorder, single episode, moderate: Secondary | ICD-10-CM | POA: Diagnosis not present

## 2014-11-06 DIAGNOSIS — Z6833 Body mass index (BMI) 33.0-33.9, adult: Secondary | ICD-10-CM | POA: Diagnosis not present

## 2014-11-06 DIAGNOSIS — L03116 Cellulitis of left lower limb: Secondary | ICD-10-CM | POA: Diagnosis not present

## 2014-11-11 DIAGNOSIS — I10 Essential (primary) hypertension: Secondary | ICD-10-CM | POA: Diagnosis not present

## 2014-11-11 DIAGNOSIS — F321 Major depressive disorder, single episode, moderate: Secondary | ICD-10-CM | POA: Diagnosis not present

## 2014-11-11 DIAGNOSIS — E782 Mixed hyperlipidemia: Secondary | ICD-10-CM | POA: Diagnosis not present

## 2014-11-11 DIAGNOSIS — Z6834 Body mass index (BMI) 34.0-34.9, adult: Secondary | ICD-10-CM | POA: Diagnosis not present

## 2014-11-11 DIAGNOSIS — I6990 Unspecified sequelae of unspecified cerebrovascular disease: Secondary | ICD-10-CM | POA: Diagnosis not present

## 2014-11-18 DIAGNOSIS — M1712 Unilateral primary osteoarthritis, left knee: Secondary | ICD-10-CM | POA: Diagnosis not present

## 2014-11-18 DIAGNOSIS — M25562 Pain in left knee: Secondary | ICD-10-CM | POA: Diagnosis not present

## 2014-11-26 DIAGNOSIS — M1712 Unilateral primary osteoarthritis, left knee: Secondary | ICD-10-CM | POA: Diagnosis not present

## 2014-12-03 DIAGNOSIS — M25562 Pain in left knee: Secondary | ICD-10-CM | POA: Diagnosis not present

## 2014-12-03 DIAGNOSIS — M1712 Unilateral primary osteoarthritis, left knee: Secondary | ICD-10-CM | POA: Diagnosis not present

## 2014-12-11 DIAGNOSIS — M25562 Pain in left knee: Secondary | ICD-10-CM | POA: Diagnosis not present

## 2014-12-11 DIAGNOSIS — M1712 Unilateral primary osteoarthritis, left knee: Secondary | ICD-10-CM | POA: Diagnosis not present

## 2014-12-12 DIAGNOSIS — R739 Hyperglycemia, unspecified: Secondary | ICD-10-CM | POA: Diagnosis not present

## 2014-12-17 DIAGNOSIS — M25562 Pain in left knee: Secondary | ICD-10-CM | POA: Diagnosis not present

## 2014-12-17 DIAGNOSIS — M1712 Unilateral primary osteoarthritis, left knee: Secondary | ICD-10-CM | POA: Diagnosis not present

## 2014-12-19 DIAGNOSIS — E119 Type 2 diabetes mellitus without complications: Secondary | ICD-10-CM | POA: Diagnosis not present

## 2015-01-13 DIAGNOSIS — E119 Type 2 diabetes mellitus without complications: Secondary | ICD-10-CM | POA: Diagnosis not present

## 2015-01-20 DIAGNOSIS — E119 Type 2 diabetes mellitus without complications: Secondary | ICD-10-CM | POA: Diagnosis not present

## 2015-02-10 DIAGNOSIS — Z6832 Body mass index (BMI) 32.0-32.9, adult: Secondary | ICD-10-CM | POA: Diagnosis not present

## 2015-02-10 DIAGNOSIS — E119 Type 2 diabetes mellitus without complications: Secondary | ICD-10-CM | POA: Diagnosis not present

## 2015-02-10 DIAGNOSIS — I6990 Unspecified sequelae of unspecified cerebrovascular disease: Secondary | ICD-10-CM | POA: Diagnosis not present

## 2015-02-10 DIAGNOSIS — E785 Hyperlipidemia, unspecified: Secondary | ICD-10-CM | POA: Diagnosis not present

## 2015-02-10 DIAGNOSIS — I10 Essential (primary) hypertension: Secondary | ICD-10-CM | POA: Diagnosis not present

## 2015-02-10 DIAGNOSIS — G8929 Other chronic pain: Secondary | ICD-10-CM | POA: Diagnosis not present

## 2015-04-15 DIAGNOSIS — I6789 Other cerebrovascular disease: Secondary | ICD-10-CM | POA: Diagnosis not present

## 2015-04-15 DIAGNOSIS — Z993 Dependence on wheelchair: Secondary | ICD-10-CM | POA: Diagnosis not present

## 2015-05-15 DIAGNOSIS — I10 Essential (primary) hypertension: Secondary | ICD-10-CM | POA: Diagnosis not present

## 2015-05-15 DIAGNOSIS — M779 Enthesopathy, unspecified: Secondary | ICD-10-CM | POA: Diagnosis not present

## 2015-05-15 DIAGNOSIS — E785 Hyperlipidemia, unspecified: Secondary | ICD-10-CM | POA: Diagnosis not present

## 2015-05-15 DIAGNOSIS — E119 Type 2 diabetes mellitus without complications: Secondary | ICD-10-CM | POA: Diagnosis not present

## 2015-05-15 DIAGNOSIS — E782 Mixed hyperlipidemia: Secondary | ICD-10-CM | POA: Diagnosis not present

## 2015-05-15 DIAGNOSIS — G8929 Other chronic pain: Secondary | ICD-10-CM | POA: Diagnosis not present

## 2015-06-03 DIAGNOSIS — Z6831 Body mass index (BMI) 31.0-31.9, adult: Secondary | ICD-10-CM | POA: Diagnosis not present

## 2015-06-03 DIAGNOSIS — Z0000 Encounter for general adult medical examination without abnormal findings: Secondary | ICD-10-CM | POA: Diagnosis not present

## 2015-08-13 DIAGNOSIS — E119 Type 2 diabetes mellitus without complications: Secondary | ICD-10-CM | POA: Diagnosis not present

## 2015-08-13 DIAGNOSIS — G8929 Other chronic pain: Secondary | ICD-10-CM | POA: Diagnosis not present

## 2015-08-13 DIAGNOSIS — Z6830 Body mass index (BMI) 30.0-30.9, adult: Secondary | ICD-10-CM | POA: Diagnosis not present

## 2015-08-13 DIAGNOSIS — M19071 Primary osteoarthritis, right ankle and foot: Secondary | ICD-10-CM | POA: Diagnosis not present

## 2015-08-13 DIAGNOSIS — E785 Hyperlipidemia, unspecified: Secondary | ICD-10-CM | POA: Diagnosis not present

## 2015-10-28 DIAGNOSIS — M7061 Trochanteric bursitis, right hip: Secondary | ICD-10-CM | POA: Diagnosis not present

## 2015-11-11 DIAGNOSIS — E785 Hyperlipidemia, unspecified: Secondary | ICD-10-CM | POA: Diagnosis not present

## 2015-11-11 DIAGNOSIS — G8929 Other chronic pain: Secondary | ICD-10-CM | POA: Diagnosis not present

## 2015-11-11 DIAGNOSIS — T84018D Broken internal joint prosthesis, other site, subsequent encounter: Secondary | ICD-10-CM | POA: Diagnosis not present

## 2015-11-11 DIAGNOSIS — I10 Essential (primary) hypertension: Secondary | ICD-10-CM | POA: Diagnosis not present

## 2015-11-11 DIAGNOSIS — E782 Mixed hyperlipidemia: Secondary | ICD-10-CM | POA: Diagnosis not present

## 2015-11-11 DIAGNOSIS — E119 Type 2 diabetes mellitus without complications: Secondary | ICD-10-CM | POA: Diagnosis not present

## 2015-12-24 DIAGNOSIS — M179 Osteoarthritis of knee, unspecified: Secondary | ICD-10-CM | POA: Diagnosis not present

## 2015-12-24 DIAGNOSIS — E782 Mixed hyperlipidemia: Secondary | ICD-10-CM | POA: Diagnosis not present

## 2015-12-24 DIAGNOSIS — E119 Type 2 diabetes mellitus without complications: Secondary | ICD-10-CM | POA: Diagnosis not present

## 2016-02-10 DIAGNOSIS — T84018D Broken internal joint prosthesis, other site, subsequent encounter: Secondary | ICD-10-CM | POA: Diagnosis not present

## 2016-02-10 DIAGNOSIS — E119 Type 2 diabetes mellitus without complications: Secondary | ICD-10-CM | POA: Diagnosis not present

## 2016-02-10 DIAGNOSIS — M19071 Primary osteoarthritis, right ankle and foot: Secondary | ICD-10-CM | POA: Diagnosis not present

## 2016-02-10 DIAGNOSIS — E782 Mixed hyperlipidemia: Secondary | ICD-10-CM | POA: Diagnosis not present

## 2016-02-10 DIAGNOSIS — E785 Hyperlipidemia, unspecified: Secondary | ICD-10-CM | POA: Diagnosis not present

## 2016-02-10 DIAGNOSIS — I10 Essential (primary) hypertension: Secondary | ICD-10-CM | POA: Diagnosis not present

## 2016-02-25 DIAGNOSIS — E119 Type 2 diabetes mellitus without complications: Secondary | ICD-10-CM | POA: Diagnosis not present

## 2016-04-01 DIAGNOSIS — M25562 Pain in left knee: Secondary | ICD-10-CM | POA: Diagnosis not present

## 2016-04-01 DIAGNOSIS — M1712 Unilateral primary osteoarthritis, left knee: Secondary | ICD-10-CM | POA: Diagnosis not present

## 2016-04-05 DIAGNOSIS — B356 Tinea cruris: Secondary | ICD-10-CM | POA: Diagnosis not present

## 2016-04-08 DIAGNOSIS — M1712 Unilateral primary osteoarthritis, left knee: Secondary | ICD-10-CM | POA: Diagnosis not present

## 2016-04-16 DIAGNOSIS — M25562 Pain in left knee: Secondary | ICD-10-CM | POA: Diagnosis not present

## 2016-04-16 DIAGNOSIS — M1712 Unilateral primary osteoarthritis, left knee: Secondary | ICD-10-CM | POA: Diagnosis not present

## 2016-04-22 DIAGNOSIS — M25562 Pain in left knee: Secondary | ICD-10-CM | POA: Diagnosis not present

## 2016-04-22 DIAGNOSIS — M1712 Unilateral primary osteoarthritis, left knee: Secondary | ICD-10-CM | POA: Diagnosis not present

## 2016-04-30 DIAGNOSIS — M1712 Unilateral primary osteoarthritis, left knee: Secondary | ICD-10-CM | POA: Diagnosis not present

## 2016-05-12 DIAGNOSIS — E782 Mixed hyperlipidemia: Secondary | ICD-10-CM | POA: Diagnosis not present

## 2016-05-12 DIAGNOSIS — G8929 Other chronic pain: Secondary | ICD-10-CM | POA: Diagnosis not present

## 2016-05-12 DIAGNOSIS — E119 Type 2 diabetes mellitus without complications: Secondary | ICD-10-CM | POA: Diagnosis not present

## 2016-05-12 DIAGNOSIS — M19071 Primary osteoarthritis, right ankle and foot: Secondary | ICD-10-CM | POA: Diagnosis not present

## 2016-05-12 DIAGNOSIS — I10 Essential (primary) hypertension: Secondary | ICD-10-CM | POA: Diagnosis not present

## 2016-05-13 DIAGNOSIS — E119 Type 2 diabetes mellitus without complications: Secondary | ICD-10-CM | POA: Diagnosis not present

## 2016-05-13 DIAGNOSIS — E782 Mixed hyperlipidemia: Secondary | ICD-10-CM | POA: Diagnosis not present

## 2016-05-13 DIAGNOSIS — I10 Essential (primary) hypertension: Secondary | ICD-10-CM | POA: Diagnosis not present

## 2016-05-13 DIAGNOSIS — E1169 Type 2 diabetes mellitus with other specified complication: Secondary | ICD-10-CM | POA: Diagnosis not present

## 2016-07-08 DIAGNOSIS — E119 Type 2 diabetes mellitus without complications: Secondary | ICD-10-CM | POA: Diagnosis not present

## 2017-01-10 DIAGNOSIS — J018 Other acute sinusitis: Secondary | ICD-10-CM | POA: Diagnosis not present

## 2017-01-10 DIAGNOSIS — M79606 Pain in leg, unspecified: Secondary | ICD-10-CM | POA: Diagnosis not present

## 2017-02-02 DIAGNOSIS — I69354 Hemiplegia and hemiparesis following cerebral infarction affecting left non-dominant side: Secondary | ICD-10-CM | POA: Diagnosis not present

## 2017-02-02 DIAGNOSIS — I10 Essential (primary) hypertension: Secondary | ICD-10-CM | POA: Diagnosis not present

## 2017-02-02 DIAGNOSIS — M545 Low back pain: Secondary | ICD-10-CM | POA: Diagnosis not present

## 2017-02-02 DIAGNOSIS — E119 Type 2 diabetes mellitus without complications: Secondary | ICD-10-CM | POA: Diagnosis not present

## 2017-02-02 DIAGNOSIS — E782 Mixed hyperlipidemia: Secondary | ICD-10-CM | POA: Diagnosis not present

## 2017-02-03 DIAGNOSIS — E782 Mixed hyperlipidemia: Secondary | ICD-10-CM | POA: Diagnosis not present

## 2017-02-03 DIAGNOSIS — E119 Type 2 diabetes mellitus without complications: Secondary | ICD-10-CM | POA: Diagnosis not present

## 2017-02-03 DIAGNOSIS — I10 Essential (primary) hypertension: Secondary | ICD-10-CM | POA: Diagnosis not present

## 2017-03-07 DIAGNOSIS — I69351 Hemiplegia and hemiparesis following cerebral infarction affecting right dominant side: Secondary | ICD-10-CM | POA: Diagnosis not present

## 2017-03-07 DIAGNOSIS — M179 Osteoarthritis of knee, unspecified: Secondary | ICD-10-CM | POA: Diagnosis not present

## 2017-03-07 DIAGNOSIS — E119 Type 2 diabetes mellitus without complications: Secondary | ICD-10-CM | POA: Diagnosis not present

## 2017-03-22 DIAGNOSIS — E119 Type 2 diabetes mellitus without complications: Secondary | ICD-10-CM | POA: Diagnosis not present

## 2017-05-11 DIAGNOSIS — E782 Mixed hyperlipidemia: Secondary | ICD-10-CM | POA: Diagnosis not present

## 2017-05-11 DIAGNOSIS — I69354 Hemiplegia and hemiparesis following cerebral infarction affecting left non-dominant side: Secondary | ICD-10-CM | POA: Diagnosis not present

## 2017-05-11 DIAGNOSIS — I10 Essential (primary) hypertension: Secondary | ICD-10-CM | POA: Diagnosis not present

## 2017-05-11 DIAGNOSIS — E119 Type 2 diabetes mellitus without complications: Secondary | ICD-10-CM | POA: Diagnosis not present

## 2017-05-11 DIAGNOSIS — M545 Low back pain: Secondary | ICD-10-CM | POA: Diagnosis not present

## 2017-07-11 DIAGNOSIS — I69354 Hemiplegia and hemiparesis following cerebral infarction affecting left non-dominant side: Secondary | ICD-10-CM | POA: Diagnosis not present

## 2017-08-10 DIAGNOSIS — I69354 Hemiplegia and hemiparesis following cerebral infarction affecting left non-dominant side: Secondary | ICD-10-CM | POA: Diagnosis not present

## 2017-08-17 DIAGNOSIS — I69354 Hemiplegia and hemiparesis following cerebral infarction affecting left non-dominant side: Secondary | ICD-10-CM | POA: Diagnosis not present

## 2017-08-17 DIAGNOSIS — E119 Type 2 diabetes mellitus without complications: Secondary | ICD-10-CM | POA: Diagnosis not present

## 2017-08-17 DIAGNOSIS — M545 Low back pain: Secondary | ICD-10-CM | POA: Diagnosis not present

## 2017-08-17 DIAGNOSIS — I10 Essential (primary) hypertension: Secondary | ICD-10-CM | POA: Diagnosis not present

## 2017-08-17 DIAGNOSIS — E782 Mixed hyperlipidemia: Secondary | ICD-10-CM | POA: Diagnosis not present

## 2017-09-10 DIAGNOSIS — I69354 Hemiplegia and hemiparesis following cerebral infarction affecting left non-dominant side: Secondary | ICD-10-CM | POA: Diagnosis not present

## 2017-10-11 DIAGNOSIS — I69354 Hemiplegia and hemiparesis following cerebral infarction affecting left non-dominant side: Secondary | ICD-10-CM | POA: Diagnosis not present

## 2017-11-08 DIAGNOSIS — I69354 Hemiplegia and hemiparesis following cerebral infarction affecting left non-dominant side: Secondary | ICD-10-CM | POA: Diagnosis not present

## 2017-12-05 DIAGNOSIS — M545 Low back pain: Secondary | ICD-10-CM | POA: Diagnosis not present

## 2017-12-05 DIAGNOSIS — I69354 Hemiplegia and hemiparesis following cerebral infarction affecting left non-dominant side: Secondary | ICD-10-CM | POA: Diagnosis not present

## 2017-12-05 DIAGNOSIS — I10 Essential (primary) hypertension: Secondary | ICD-10-CM | POA: Diagnosis not present

## 2017-12-05 DIAGNOSIS — E782 Mixed hyperlipidemia: Secondary | ICD-10-CM | POA: Diagnosis not present

## 2017-12-05 DIAGNOSIS — E119 Type 2 diabetes mellitus without complications: Secondary | ICD-10-CM | POA: Diagnosis not present

## 2017-12-09 DIAGNOSIS — I69354 Hemiplegia and hemiparesis following cerebral infarction affecting left non-dominant side: Secondary | ICD-10-CM | POA: Diagnosis not present

## 2018-01-08 DIAGNOSIS — I69354 Hemiplegia and hemiparesis following cerebral infarction affecting left non-dominant side: Secondary | ICD-10-CM | POA: Diagnosis not present

## 2018-02-08 DIAGNOSIS — I69354 Hemiplegia and hemiparesis following cerebral infarction affecting left non-dominant side: Secondary | ICD-10-CM | POA: Diagnosis not present

## 2018-03-08 DIAGNOSIS — I10 Essential (primary) hypertension: Secondary | ICD-10-CM | POA: Diagnosis not present

## 2018-03-08 DIAGNOSIS — G8929 Other chronic pain: Secondary | ICD-10-CM | POA: Diagnosis not present

## 2018-03-08 DIAGNOSIS — I69352 Hemiplegia and hemiparesis following cerebral infarction affecting left dominant side: Secondary | ICD-10-CM | POA: Diagnosis not present

## 2018-03-08 DIAGNOSIS — E782 Mixed hyperlipidemia: Secondary | ICD-10-CM | POA: Diagnosis not present

## 2018-03-08 DIAGNOSIS — E1169 Type 2 diabetes mellitus with other specified complication: Secondary | ICD-10-CM | POA: Diagnosis not present

## 2018-03-10 DIAGNOSIS — I69354 Hemiplegia and hemiparesis following cerebral infarction affecting left non-dominant side: Secondary | ICD-10-CM | POA: Diagnosis not present

## 2018-05-22 ENCOUNTER — Other Ambulatory Visit: Payer: Self-pay

## 2018-05-22 ENCOUNTER — Other Ambulatory Visit: Payer: Self-pay | Admitting: Pharmacy Technician

## 2018-05-22 NOTE — Patient Outreach (Signed)
[place] [facility]) Care Management  05/22/2018  Marcus Klein [birth year] [id number]   57 year old male outreached by [place] services for medication assistance for Januvia.   PMHx includes, but not limited to, h/o right TKA and diabetes.   Successful outreach attempt to Marcus Klein.  HIPAA identifiers verified.   Medication Assistance: Marcus Klein reports that he cannot afford his Januvia.  Per financial discussion, he would be right above the income limit for [employer].  Marcus Klein may be eligible for Januvia patient assistance from [employer].    Plan: Route letter to [facility] CPhT, Etter Sjogren to begin Januvia patient assistance application.  Joetta Manners, PharmD Clinical Pharmacist [place] [phone]

## 2018-05-22 NOTE — Patient Outreach (Signed)
[place] [facility]) Care Management  05/22/2018  Marcus Klein 1961/04/27 [id number]   Medication Adherence call to Mr. Marcus Klein spoke with patient he is due on Januvia 10 mg patient said he is getting samples from the doctors office because he is in the donut hole he said he can not afford it at this time, and will ask doctor to give him more samples patient will be refer to Anderson Malta the pharmacist for farther assistance with patient's Assistance on [place]. Mr. Vivero is showing past due under [place].   [place] Management Direct Dial [phone]  Fax [phone] [email or web address]

## 2018-05-22 NOTE — Patient Outreach (Signed)
[place] [facility]) Care Management  05/22/2018  Marcus Klein Sep 13, 1960 [id number]   Received Marcus Klein patient assistance referral from Marcus Klein for Marcus Klein. Prepared patient portion to be mailed and prepared provider portion to be mailed to Dr. Henrene Pastor.  Will follow up with patient in 7-10 business days to confirm application has been received.  Marcus Klein [place], [place] Management [phone]

## 2018-06-05 ENCOUNTER — Other Ambulatory Visit: Payer: Self-pay | Admitting: Pharmacy Technician

## 2018-06-05 NOTE — Patient Outreach (Signed)
[place] [facility]) Care Management  06/05/2018  ABOU STERKEL [birth year] [id number]   Received provider portion of [employer] patient assistance application for Januvia. Prepared completed application to be mailed to [employer].  Will follow up with company in 10-14 business days to confirm application has been received.  Maud Deed [place], [place] Management [phone]

## 2018-06-09 DIAGNOSIS — M545 Low back pain: Secondary | ICD-10-CM | POA: Diagnosis not present

## 2018-06-09 DIAGNOSIS — E1169 Type 2 diabetes mellitus with other specified complication: Secondary | ICD-10-CM | POA: Diagnosis not present

## 2018-06-09 DIAGNOSIS — E782 Mixed hyperlipidemia: Secondary | ICD-10-CM | POA: Diagnosis not present

## 2018-06-09 DIAGNOSIS — I10 Essential (primary) hypertension: Secondary | ICD-10-CM | POA: Diagnosis not present

## 2018-06-09 DIAGNOSIS — I69351 Hemiplegia and hemiparesis following cerebral infarction affecting right dominant side: Secondary | ICD-10-CM | POA: Diagnosis not present

## 2018-06-16 ENCOUNTER — Other Ambulatory Visit: Payer: Self-pay | Admitting: Pharmacy Technician

## 2018-06-16 NOTE — Patient Outreach (Signed)
[place] [facility]) Care Management  06/16/2018  Marcus Klein [birth year] [id number]   Follow up call to [employer] patient assistance, Ovid Curd stated application has been received and attestation letter was mailed out to patient on 10/08.  Unsuccessful call to patient on both numbers on file, no voicemail on either line.  Incoming call from patient while documenting. Informed patient that [employer] mailed out attestation letter on 10/08 and to please contact me once he has received it so that I can help him fill it out. Patient stated that he would call me.  Will follow up with patient in 3-5 business days if he has not contacted me.  Maud Deed [place], [place] Management [phone]

## 2018-06-21 ENCOUNTER — Ambulatory Visit: Payer: Self-pay | Admitting: Pharmacy Technician

## 2018-06-21 ENCOUNTER — Other Ambulatory Visit: Payer: Self-pay | Admitting: Pharmacy Technician

## 2018-06-21 NOTE — Patient Outreach (Signed)
[place] [facility]) Care Management  06/21/2018  VEDH PTACEK [birth year] [id number]   Return call to patient, Marcus Klein states that he received paperwork that was mailed from [employer] patient assistance. Helped him fill out items on attestation letter and informed him of items that needed to be included to mail back.  Will follow up with [employer] in 7-10 business days to confirm attestation has been returned.  [name], [place] Management [phone]

## 2018-06-23 DIAGNOSIS — E118 Type 2 diabetes mellitus with unspecified complications: Secondary | ICD-10-CM | POA: Diagnosis not present

## 2018-07-03 ENCOUNTER — Other Ambulatory Visit: Payer: Self-pay | Admitting: Pharmacy Technician

## 2018-07-03 NOTE — Patient Outreach (Signed)
[place] [facility]) Care Management  07/03/2018  Marcus Klein [birth year] [id number]  Successful outreach call placed to [employer] patient assistance to inquire about the status of medication assistance application for Januvia,  Per [name], patient has been approved for Januvia made by [employer] and prescribed by Dr. Reinaldo Meeker. Approved 06/29/2018 and expires 09/05/2018. No shipping information is available yet and it was advised to check back on November 1st.  [place] also said that patient could apply for the year 2020 after August 07, 2018.  Will route note to [place] for followup.  Ashiah Karpowicz P. Elpidio Thielen, [place] Management [phone]

## 2018-07-31 ENCOUNTER — Other Ambulatory Visit: Payer: Self-pay | Admitting: Pharmacy Technician

## 2018-07-31 NOTE — Patient Outreach (Signed)
[facility]) Care Management  07/31/2018  Marcus Klein 01-May-1961 [id number]   Unsuccessful call #1 to patient to confirm medication has been received from [employer] patient assistance and to discuss reapplying for 2020.  Will make 2nd call attempt in 4-5 business days (due to holiday) if call has not been returned.  Maud Deed [name] [place] Certified Pharmacy Technician [place] Management Direct [phone]

## 2018-08-08 ENCOUNTER — Encounter: Payer: Self-pay | Admitting: Pharmacy Technician

## 2018-08-08 ENCOUNTER — Other Ambulatory Visit: Payer: Self-pay | Admitting: Pharmacy Technician

## 2018-08-08 NOTE — Patient Outreach (Signed)
[place] [facility]) Care Management  08/08/2018  Marcus Klein September 08, 1960 [id number]     Successful call placed to patient regarding patient assistance medication receipt from [place], HIPAA identifiers verified. Marcus Klein confirms that he received his Januvia and is interested in reapplying for 2020.                                                  Medication/Company: [place]  Patient portion: Mail Provider portion: Mail to Dr. Henrene Pastor  Will follow up with patient in 5-7 business days to confirm application(s) have been received.  Maud Deed [name] [place] Certified Pharmacy Technician [place] Management Direct Dial:[phone]

## 2018-08-21 DIAGNOSIS — I69354 Hemiplegia and hemiparesis following cerebral infarction affecting left non-dominant side: Secondary | ICD-10-CM | POA: Diagnosis not present

## 2018-08-28 ENCOUNTER — Other Ambulatory Visit: Payer: Self-pay | Admitting: Pharmacy Technician

## 2018-08-28 NOTE — Patient Outreach (Signed)
[place] [facility]) Care Management  08/28/2018  BRAX WALEN [birth year] [id number]   Received patient portion of [employer] patient assistance application for Januvia. Prepared completed application to be mailed to company.  Will follow up with company in 10-14 business days  [employer]. [name] [place] Certified Pharmacy Technician [place] Management Direct [phone]

## 2018-09-11 DIAGNOSIS — I10 Essential (primary) hypertension: Secondary | ICD-10-CM | POA: Diagnosis not present

## 2018-09-11 DIAGNOSIS — I69351 Hemiplegia and hemiparesis following cerebral infarction affecting right dominant side: Secondary | ICD-10-CM | POA: Diagnosis not present

## 2018-09-11 DIAGNOSIS — E1169 Type 2 diabetes mellitus with other specified complication: Secondary | ICD-10-CM | POA: Diagnosis not present

## 2018-09-11 DIAGNOSIS — E782 Mixed hyperlipidemia: Secondary | ICD-10-CM | POA: Diagnosis not present

## 2018-10-03 ENCOUNTER — Other Ambulatory Visit: Payer: Self-pay | Admitting: Pharmacy Technician

## 2018-10-03 NOTE — Patient Outreach (Addendum)
[place] [facility]) Care Management  10/03/2018  Marcus Klein Aug 26, 1961 [id number]    Successful call placed to patient regarding patient assistance update for Januvia thru [employer], HIPAA identifiers verified. Informed patient that [employer] has mailed out attestation form as of 1/21. Requested that he contact me once received so that I can help Marcus Klein fill out. He states that his sugar has been running high and that its possible that he maybe getting switched to another medication. I requested that he let me know if it changes as well so that we can start the process if its a drug that has patient assistance available for it. He stated that he would contact me when he receives the letter as well as if the medication changes.  Will follow up with patient in 3-5 business days if call has not been returned.  Maud Deed [name] [place] Certified Pharmacy Technician [place] Management Direct Dial:[phone]   ADDENDUM 12:01pm  Incoming call from patient stating her received the attestation form from [place]. Assisted Marcus Klein with filling it out.  Will follow up with company in 7-10 business days to confirm it has been received by the company.  Maud Deed [name] [place] Certified Pharmacy Technician [place] Management Direct Dial:[phone]

## 2018-10-16 ENCOUNTER — Other Ambulatory Visit: Payer: Self-pay | Admitting: Pharmacy Technician

## 2018-10-16 NOTE — Patient Outreach (Signed)
[place] [facility]) Care Management  10/16/2018  JADA KUHNERT February 21, 1961 [id number]    Follow up call placed to [employer] regarding patient assistance application(s) for Genia Hotter confirms attestation form has been received. Patient has been approved as of 2/3 until 09/06/19.    Successful call placed to patient regarding patient assistance update for Januvia, HIPAA identifiers verified. Provided Mr. Guarino with [facility] phone number to follow up on his refill shipment status. Patient states there is still a possibility that he provider will switch him to another medication. Requested that he still contact me if that does happen.  Will follow up with patient in 7-10 business days to confirm medication has been received from [employer].  Maud Deed [name] [place] Certified Pharmacy Technician [place] Management Direct Dial:[phone]

## 2018-10-31 DIAGNOSIS — E1169 Type 2 diabetes mellitus with other specified complication: Secondary | ICD-10-CM | POA: Diagnosis not present

## 2018-10-31 DIAGNOSIS — Z0000 Encounter for general adult medical examination without abnormal findings: Secondary | ICD-10-CM | POA: Diagnosis not present

## 2018-11-15 ENCOUNTER — Other Ambulatory Visit: Payer: Self-pay

## 2018-11-15 ENCOUNTER — Other Ambulatory Visit: Payer: Self-pay | Admitting: Pharmacy Technician

## 2018-11-15 NOTE — Patient Outreach (Signed)
[place] [facility]) Care Management [place]  11/15/2018  Marcus Klein 1960-09-09 [id number]  Reason for referral: medication assistance  [facility] pharmacy case is being closed due to the following reasons:  Goals have been met.  Approved for Januvia patient assistance.   Patient has been provided [facility] CM contact information if assistance needed in the future.    Thank you for allowing [facility] pharmacy to be involved in this patient's care.    Joetta Manners, PharmD Clinical Pharmacist [place] [phone]

## 2018-11-15 NOTE — Patient Outreach (Signed)
[facility]) Care Management  11/15/2018  Marcus Klein Nov 09, 1960 [id number]    Successful call placed to patient regarding patient assistance medication receipt from [facility], HIPAA identifiers verified. [name] confirms that he received his Januvia. Reviewed with patient how to obtain refills.  Follow up:  Will route note to [place] for case closure.  Maud Deed [name] [place] Certified Pharmacy Technician [place] Management Direct Dial:[phone]

## 2018-11-27 DIAGNOSIS — H2513 Age-related nuclear cataract, bilateral: Secondary | ICD-10-CM | POA: Diagnosis not present

## 2018-12-12 DIAGNOSIS — E1169 Type 2 diabetes mellitus with other specified complication: Secondary | ICD-10-CM | POA: Diagnosis not present

## 2018-12-12 DIAGNOSIS — I10 Essential (primary) hypertension: Secondary | ICD-10-CM | POA: Diagnosis not present

## 2018-12-12 DIAGNOSIS — I69351 Hemiplegia and hemiparesis following cerebral infarction affecting right dominant side: Secondary | ICD-10-CM | POA: Diagnosis not present

## 2018-12-12 DIAGNOSIS — E782 Mixed hyperlipidemia: Secondary | ICD-10-CM | POA: Diagnosis not present

## 2018-12-12 DIAGNOSIS — G9009 Other idiopathic peripheral autonomic neuropathy: Secondary | ICD-10-CM | POA: Diagnosis not present

## 2018-12-18 DIAGNOSIS — I69354 Hemiplegia and hemiparesis following cerebral infarction affecting left non-dominant side: Secondary | ICD-10-CM | POA: Diagnosis not present

## 2019-01-01 DIAGNOSIS — E118 Type 2 diabetes mellitus with unspecified complications: Secondary | ICD-10-CM | POA: Diagnosis not present

## 2019-02-08 ENCOUNTER — Other Ambulatory Visit: Payer: Self-pay | Admitting: Pharmacist

## 2019-07-18 DIAGNOSIS — I10 Essential (primary) hypertension: Secondary | ICD-10-CM | POA: Diagnosis not present

## 2019-07-18 DIAGNOSIS — M545 Low back pain: Secondary | ICD-10-CM | POA: Diagnosis not present

## 2019-07-18 DIAGNOSIS — E782 Mixed hyperlipidemia: Secondary | ICD-10-CM | POA: Diagnosis not present

## 2019-07-18 DIAGNOSIS — E1169 Type 2 diabetes mellitus with other specified complication: Secondary | ICD-10-CM | POA: Diagnosis not present

## 2019-07-18 DIAGNOSIS — I69351 Hemiplegia and hemiparesis following cerebral infarction affecting right dominant side: Secondary | ICD-10-CM | POA: Diagnosis not present

## 2019-09-06 LAB — FECAL OCCULT BLOOD, GUAIAC: Fecal Occult Blood: NEGATIVE

## 2019-09-25 LAB — LAB REPORT - SCANNED

## 2019-09-27 DIAGNOSIS — E118 Type 2 diabetes mellitus with unspecified complications: Secondary | ICD-10-CM | POA: Diagnosis not present

## 2019-10-08 ENCOUNTER — Other Ambulatory Visit: Payer: Self-pay | Admitting: Legal Medicine

## 2019-10-08 MED ORDER — HYDROCODONE-ACETAMINOPHEN 7.5-325 MG PO TABS: 1.0000 | ORAL_TABLET | Freq: Four times a day (QID) | ORAL | 0 refills | Status: DC

## 2019-10-16 ENCOUNTER — Other Ambulatory Visit: Payer: Self-pay | Admitting: Legal Medicine

## 2019-10-23 ENCOUNTER — Other Ambulatory Visit: Payer: Self-pay | Admitting: Legal Medicine

## 2019-11-05 ENCOUNTER — Other Ambulatory Visit: Payer: Self-pay

## 2019-11-05 MED ORDER — SILDENAFIL CITRATE 20 MG PO TABS: 20.0000 mg | ORAL_TABLET | Freq: Three times a day (TID) | ORAL | 0 refills | Status: DC | PRN

## 2019-11-05 MED ORDER — HYDROCODONE-ACETAMINOPHEN 10-325 MG PO TABS: 1.0000 | ORAL_TABLET | Freq: Four times a day (QID) | ORAL | 0 refills | Status: DC | PRN

## 2019-11-13 DIAGNOSIS — N5201 Erectile dysfunction due to arterial insufficiency: Secondary | ICD-10-CM | POA: Insufficient documentation

## 2019-11-13 DIAGNOSIS — M179 Osteoarthritis of knee, unspecified: Secondary | ICD-10-CM | POA: Insufficient documentation

## 2019-11-13 DIAGNOSIS — I69351 Hemiplegia and hemiparesis following cerebral infarction affecting right dominant side: Secondary | ICD-10-CM | POA: Insufficient documentation

## 2019-11-13 DIAGNOSIS — E669 Obesity, unspecified: Secondary | ICD-10-CM | POA: Insufficient documentation

## 2019-11-13 DIAGNOSIS — Z79891 Long term (current) use of opiate analgesic: Secondary | ICD-10-CM | POA: Insufficient documentation

## 2019-11-13 DIAGNOSIS — E782 Mixed hyperlipidemia: Secondary | ICD-10-CM | POA: Insufficient documentation

## 2019-11-14 ENCOUNTER — Other Ambulatory Visit: Payer: Self-pay

## 2019-11-14 ENCOUNTER — Encounter: Payer: Self-pay | Admitting: Legal Medicine

## 2019-11-14 ENCOUNTER — Ambulatory Visit (INDEPENDENT_AMBULATORY_CARE_PROVIDER_SITE_OTHER): Payer: Medicare Other | Admitting: Legal Medicine

## 2019-11-14 DIAGNOSIS — E1159 Type 2 diabetes mellitus with other circulatory complications: Secondary | ICD-10-CM | POA: Diagnosis not present

## 2019-11-14 DIAGNOSIS — E669 Obesity, unspecified: Secondary | ICD-10-CM

## 2019-11-14 DIAGNOSIS — I69351 Hemiplegia and hemiparesis following cerebral infarction affecting right dominant side: Secondary | ICD-10-CM | POA: Diagnosis not present

## 2019-11-14 DIAGNOSIS — I10 Essential (primary) hypertension: Secondary | ICD-10-CM

## 2019-11-14 DIAGNOSIS — N5201 Erectile dysfunction due to arterial insufficiency: Secondary | ICD-10-CM

## 2019-11-14 DIAGNOSIS — E782 Mixed hyperlipidemia: Secondary | ICD-10-CM

## 2019-11-14 DIAGNOSIS — E1169 Type 2 diabetes mellitus with other specified complication: Secondary | ICD-10-CM

## 2019-11-14 DIAGNOSIS — M179 Osteoarthritis of knee, unspecified: Secondary | ICD-10-CM

## 2019-11-14 DIAGNOSIS — Z79891 Long term (current) use of opiate analgesic: Secondary | ICD-10-CM

## 2019-11-14 LAB — POCT UA - MICROALBUMIN: Microalbumin Ur, POC: 80 mg/L

## 2019-11-14 MED ORDER — HYDROCODONE-ACETAMINOPHEN 10-325 MG PO TABS: 1.0000 | ORAL_TABLET | Freq: Four times a day (QID) | ORAL | 0 refills | Status: DC | PRN

## 2019-11-14 MED ORDER — SILDENAFIL CITRATE 50 MG PO TABS: 50.0000 mg | ORAL_TABLET | Freq: Every day | ORAL | 3 refills | Status: DC | PRN

## 2019-11-14 NOTE — Assessment & Plan Note (Signed)
An individual care plan was established and reinforced today.  The patient's status was assessed using clinical findings on exam, labs and diagnostic testing. Patient success at meeting goals based on disease specific evidence-based guidelines and found to be good controlled. Medications were assessed and patient's understanding of the medical issues , including barriers were assessed. Recommend adherence to a diabetic diet, a graduated exercise program, HgbA1c level is checked quarterly, and urine microalbumin performed yearly .  Annual mono-filament sensation testing performed. Lower blood pressure and control hyperlipidemia is important. Get annual eye exams and annual flu shots and smoking cessation discussed.  Self management goals were discussed. We discussed need to check Blood Sugars but patient is afraid to pick his finger.
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
## 2019-11-14 NOTE — Assessment & Plan Note (Signed)
Chronic problem.  sildanofil 20 mg is not available, change to 50mg  pills. He has a girlfriend.

## 2019-11-14 NOTE — Assessment & Plan Note (Signed)

## 2019-11-14 NOTE — Assessment & Plan Note (Signed)
No evidence for abuse, state site reviewed, Negative REMS

## 2019-11-14 NOTE — Assessment & Plan Note (Signed)
Patient was evaluated using information from exam, tests and other diagnostic studies to perform evidence-based treatment for this disorder.  Opimizing treatment and improvement of neurologic deficits.  Patient is using cane to maintain as much independence as possible. Patient using cane to walk.  Patient has full ability to perform ADLs. He has chronic pain from accident .  His stroke was at [facility] during treatment.

## 2019-11-14 NOTE — Assessment & Plan Note (Addendum)
AN INDIVIDUAL CARE PLAN was established and reinforced today.  The patient's status was assessed using clinical findings on exam, labs, and other diagnostic testing. Patient's success at meeting treatment goals based on disease specific evidence-bassed guidelines and found to be in good control. RECOMMENDATIONS include maintaining present medicines and treatment. 

## 2019-11-15 LAB — CBC WITH DIFFERENTIAL/PLATELET
Basophils Absolute: 0.1 10*3/uL (ref 0.0–0.2)
Basos: 1 %
EOS (ABSOLUTE): 0.2 10*3/uL (ref 0.0–0.4)
Eos: 2 %
Hematocrit: 51.1 % — ABNORMAL HIGH (ref 37.5–51.0)
Hemoglobin: 16.7 g/dL (ref 13.0–17.7)
Immature Grans (Abs): 0 10*3/uL (ref 0.0–0.1)
Immature Granulocytes: 0 %
Lymphocytes Absolute: 2.3 10*3/uL (ref 0.7–3.1)
Lymphs: 29 %
MCH: 28.9 pg (ref 26.6–33.0)
MCHC: 32.7 g/dL (ref 31.5–35.7)
MCV: 89 fL (ref 79–97)
Monocytes Absolute: 0.7 10*3/uL (ref 0.1–0.9)
Monocytes: 10 %
Neutrophils Absolute: 4.5 10*3/uL (ref 1.4–7.0)
Neutrophils: 58 %
Platelets: 268 10*3/uL (ref 150–450)
RBC: 5.77 x10E6/uL (ref 4.14–5.80)
RDW: 13.6 % (ref 11.6–15.4)
WBC: 7.8 10*3/uL (ref 3.4–10.8)

## 2019-11-15 LAB — COMPREHENSIVE METABOLIC PANEL
ALT: 37 IU/L (ref 0–44)
AST: 27 IU/L (ref 0–40)
Albumin/Globulin Ratio: 1.5 (ref 1.2–2.2)
Albumin: 4.6 g/dL (ref 3.8–4.9)
Alkaline Phosphatase: 148 IU/L — ABNORMAL HIGH (ref 39–117)
BUN/Creatinine Ratio: 19 (ref 9–20)
BUN: 22 mg/dL (ref 6–24)
Bilirubin Total: 0.6 mg/dL (ref 0.0–1.2)
CO2: 26 mmol/L (ref 20–29)
Calcium: 9.8 mg/dL (ref 8.7–10.2)
Chloride: 97 mmol/L (ref 96–106)
Creatinine, Ser: 1.15 mg/dL (ref 0.76–1.27)
GFR calc Af Amer: 81 mL/min/{1.73_m2} (ref >59)
GFR calc non Af Amer: 70 mL/min/{1.73_m2} (ref >59)
Globulin, Total: 3.1 g/dL (ref 1.5–4.5)
Glucose: 173 mg/dL — ABNORMAL HIGH (ref 65–99)
Potassium: 4.5 mmol/L (ref 3.5–5.2)
Sodium: 139 mmol/L (ref 134–144)
Total Protein: 7.7 g/dL (ref 6.0–8.5)

## 2019-11-15 LAB — HEMOGLOBIN A1C
Est. average glucose Bld gHb Est-mCnc: 203 mg/dL
Hgb A1c MFr Bld: 8.7 % — ABNORMAL HIGH (ref 4.8–5.6)

## 2019-11-15 LAB — LIPID PANEL
Chol/HDL Ratio: 5.1 ratio — ABNORMAL HIGH (ref 0.0–5.0)
Cholesterol, Total: 190 mg/dL (ref 100–199)
HDL: 37 mg/dL — ABNORMAL LOW (ref >39)
LDL Chol Calc (NIH): 106 mg/dL — ABNORMAL HIGH (ref 0–99)
Triglycerides: 275 mg/dL — ABNORMAL HIGH (ref 0–149)
VLDL Cholesterol Cal: 47 mg/dL — ABNORMAL HIGH (ref 5–40)

## 2019-11-15 LAB — CARDIOVASCULAR RISK ASSESSMENT

## 2019-11-16 NOTE — Progress Notes (Signed)
Cbc normal, (hematocrit must be machine error), Glucose high 173 with A1c 8.7- needs to work on diet - I recommend changing  to janumet, cholesterol high- need to stay eith diet and medicines lp

## 2019-11-19 ENCOUNTER — Other Ambulatory Visit: Payer: Self-pay

## 2019-11-19 MED ORDER — SITAGLIP PHOS-METFORMIN HCL ER 100-1000 MG PO TB24: 100.0000 mg | ORAL_TABLET | Freq: Every day | ORAL | 6 refills | Status: DC

## 2019-11-26 ENCOUNTER — Telehealth: Payer: Self-pay

## 2019-11-26 NOTE — Telephone Encounter (Signed)
Patient needs an order for battery of the wheelchair to be faxed. Phone [phone].

## 2019-12-06 ENCOUNTER — Other Ambulatory Visit: Payer: Self-pay

## 2019-12-06 DIAGNOSIS — Z79891 Long term (current) use of opiate analgesic: Secondary | ICD-10-CM

## 2019-12-06 DIAGNOSIS — E669 Obesity, unspecified: Secondary | ICD-10-CM

## 2019-12-06 DIAGNOSIS — E1159 Type 2 diabetes mellitus with other circulatory complications: Secondary | ICD-10-CM

## 2019-12-06 MED ORDER — HYDROCODONE-ACETAMINOPHEN 10-325 MG PO TABS
1.0000 | ORAL_TABLET | Freq: Four times a day (QID) | ORAL | 0 refills | Status: DC | PRN
Start: 2019-12-06 — End: 2020-01-07

## 2019-12-25 ENCOUNTER — Other Ambulatory Visit: Payer: Self-pay

## 2019-12-25 MED ORDER — SITAGLIP PHOS-METFORMIN HCL ER 100-1000 MG PO TB24: 100.0000 mg | ORAL_TABLET | Freq: Every day | ORAL | 6 refills | Status: DC

## 2019-12-26 ENCOUNTER — Other Ambulatory Visit: Payer: Self-pay

## 2019-12-26 DIAGNOSIS — I152 Hypertension secondary to endocrine disorders: Secondary | ICD-10-CM

## 2019-12-26 DIAGNOSIS — E669 Obesity, unspecified: Secondary | ICD-10-CM

## 2019-12-26 MED ORDER — SITAGLIP PHOS-METFORMIN HCL ER 100-1000 MG PO TB24: 100.0000 mg | ORAL_TABLET | Freq: Every day | ORAL | 2 refills | Status: DC

## 2020-01-07 ENCOUNTER — Other Ambulatory Visit: Payer: Self-pay

## 2020-01-07 DIAGNOSIS — Z79891 Long term (current) use of opiate analgesic: Secondary | ICD-10-CM

## 2020-01-07 DIAGNOSIS — N5201 Erectile dysfunction due to arterial insufficiency: Secondary | ICD-10-CM

## 2020-01-07 MED ORDER — HYDROCODONE-ACETAMINOPHEN 10-325 MG PO TABS: 1.0000 | ORAL_TABLET | Freq: Four times a day (QID) | ORAL | 0 refills | Status: DC | PRN

## 2020-01-07 MED ORDER — SILDENAFIL CITRATE 50 MG PO TABS: 50.0000 mg | ORAL_TABLET | Freq: Every day | ORAL | 3 refills | Status: DC | PRN

## 2020-01-27 DIAGNOSIS — B356 Tinea cruris: Secondary | ICD-10-CM | POA: Diagnosis not present

## 2020-01-27 DIAGNOSIS — R21 Rash and other nonspecific skin eruption: Secondary | ICD-10-CM | POA: Diagnosis not present

## 2020-02-06 ENCOUNTER — Other Ambulatory Visit: Payer: Self-pay

## 2020-02-06 DIAGNOSIS — N5201 Erectile dysfunction due to arterial insufficiency: Secondary | ICD-10-CM

## 2020-02-06 DIAGNOSIS — Z79891 Long term (current) use of opiate analgesic: Secondary | ICD-10-CM

## 2020-02-06 MED ORDER — HYDROCODONE-ACETAMINOPHEN 10-325 MG PO TABS: 1.0000 | ORAL_TABLET | Freq: Four times a day (QID) | ORAL | 0 refills | Status: DC | PRN

## 2020-02-06 MED ORDER — SILDENAFIL CITRATE 50 MG PO TABS: 50.0000 mg | ORAL_TABLET | Freq: Every day | ORAL | 3 refills | Status: DC | PRN

## 2020-03-05 ENCOUNTER — Ambulatory Visit (INDEPENDENT_AMBULATORY_CARE_PROVIDER_SITE_OTHER): Payer: Medicare Other | Admitting: Legal Medicine

## 2020-03-05 ENCOUNTER — Other Ambulatory Visit: Payer: Self-pay

## 2020-03-05 ENCOUNTER — Encounter: Payer: Self-pay | Admitting: Legal Medicine

## 2020-03-05 VITALS — BP 110/70 | HR 63 | Temp 97.4°F | Resp 16 | Ht 71.0 in | Wt 223.0 lb

## 2020-03-05 DIAGNOSIS — I69351 Hemiplegia and hemiparesis following cerebral infarction affecting right dominant side: Secondary | ICD-10-CM

## 2020-03-05 DIAGNOSIS — E871 Hypo-osmolality and hyponatremia: Secondary | ICD-10-CM

## 2020-03-05 DIAGNOSIS — E1159 Type 2 diabetes mellitus with other circulatory complications: Secondary | ICD-10-CM

## 2020-03-05 DIAGNOSIS — M179 Osteoarthritis of knee, unspecified: Secondary | ICD-10-CM

## 2020-03-05 DIAGNOSIS — E669 Obesity, unspecified: Secondary | ICD-10-CM

## 2020-03-05 DIAGNOSIS — N5201 Erectile dysfunction due to arterial insufficiency: Secondary | ICD-10-CM

## 2020-03-05 DIAGNOSIS — E782 Mixed hyperlipidemia: Secondary | ICD-10-CM | POA: Diagnosis not present

## 2020-03-05 DIAGNOSIS — I152 Hypertension secondary to endocrine disorders: Secondary | ICD-10-CM

## 2020-03-05 DIAGNOSIS — E1169 Type 2 diabetes mellitus with other specified complication: Secondary | ICD-10-CM | POA: Diagnosis not present

## 2020-03-05 DIAGNOSIS — I10 Essential (primary) hypertension: Secondary | ICD-10-CM | POA: Diagnosis not present

## 2020-03-05 DIAGNOSIS — Z79891 Long term (current) use of opiate analgesic: Secondary | ICD-10-CM

## 2020-03-05 MED ORDER — HYDROCODONE-ACETAMINOPHEN 10-325 MG PO TABS: 1.0000 | ORAL_TABLET | Freq: Four times a day (QID) | ORAL | 0 refills | Status: DC | PRN
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
## 2020-03-07 NOTE — Progress Notes (Signed)
CBC normal, glucose 131, kidney tests normal, liver tests normal, triglycerides high, A1c 6.8 lp

## 2020-03-08 LAB — LIPID PANEL
Chol/HDL Ratio: 4.7 ratio (ref 0.0–5.0)
Cholesterol, Total: 166 mg/dL (ref 100–199)
HDL: 35 mg/dL — ABNORMAL LOW (ref >39)
LDL Chol Calc (NIH): 96 mg/dL (ref 0–99)
Triglycerides: 201 mg/dL — ABNORMAL HIGH (ref 0–149)
VLDL Cholesterol Cal: 35 mg/dL (ref 5–40)

## 2020-03-08 LAB — COMPREHENSIVE METABOLIC PANEL
ALT: 35 IU/L (ref 0–44)
AST: 20 IU/L (ref 0–40)
Albumin/Globulin Ratio: 1.5 (ref 1.2–2.2)
Albumin: 4.5 g/dL (ref 3.8–4.9)
Alkaline Phosphatase: 109 IU/L (ref 48–121)
BUN/Creatinine Ratio: 19 (ref 9–20)
BUN: 24 mg/dL (ref 6–24)
Bilirubin Total: 0.6 mg/dL (ref 0.0–1.2)
CO2: 24 mmol/L (ref 20–29)
Calcium: 9.9 mg/dL (ref 8.7–10.2)
Chloride: 98 mmol/L (ref 96–106)
Creatinine, Ser: 1.27 mg/dL (ref 0.76–1.27)
GFR calc Af Amer: 72 mL/min/{1.73_m2} (ref >59)
GFR calc non Af Amer: 62 mL/min/{1.73_m2} (ref >59)
Globulin, Total: 3 g/dL (ref 1.5–4.5)
Glucose: 131 mg/dL — ABNORMAL HIGH (ref 65–99)
Potassium: 4.3 mmol/L (ref 3.5–5.2)
Sodium: 140 mmol/L (ref 134–144)
Total Protein: 7.5 g/dL (ref 6.0–8.5)

## 2020-03-08 LAB — CBC WITH DIFFERENTIAL/PLATELET
Basophils Absolute: 0.1 10*3/uL (ref 0.0–0.2)
Basos: 1 %
EOS (ABSOLUTE): 0.2 10*3/uL (ref 0.0–0.4)
Eos: 2 %
Hematocrit: 47.6 % (ref 37.5–51.0)
Hemoglobin: 15.7 g/dL (ref 13.0–17.7)
Immature Grans (Abs): 0 10*3/uL (ref 0.0–0.1)
Immature Granulocytes: 0 %
Lymphocytes Absolute: 2.4 10*3/uL (ref 0.7–3.1)
Lymphs: 30 %
MCH: 29.3 pg (ref 26.6–33.0)
MCHC: 33 g/dL (ref 31.5–35.7)
MCV: 89 fL (ref 79–97)
Monocytes Absolute: 0.9 10*3/uL (ref 0.1–0.9)
Monocytes: 11 %
Neutrophils Absolute: 4.6 10*3/uL (ref 1.4–7.0)
Neutrophils: 56 %
Platelets: 267 10*3/uL (ref 150–450)
RBC: 5.36 x10E6/uL (ref 4.14–5.80)
RDW: 13.8 % (ref 11.6–15.4)
WBC: 8.1 10*3/uL (ref 3.4–10.8)

## 2020-03-08 LAB — CARDIOVASCULAR RISK ASSESSMENT

## 2020-03-08 LAB — HEMOGLOBIN A1C
Est. average glucose Bld gHb Est-mCnc: 148 mg/dL
Hgb A1c MFr Bld: 6.8 % — ABNORMAL HIGH (ref 4.8–5.6)

## 2020-04-04 ENCOUNTER — Other Ambulatory Visit: Payer: Self-pay

## 2020-04-04 DIAGNOSIS — Z79891 Long term (current) use of opiate analgesic: Secondary | ICD-10-CM

## 2020-04-04 MED ORDER — HYDROCODONE-ACETAMINOPHEN 10-325 MG PO TABS: 1.0000 | ORAL_TABLET | Freq: Four times a day (QID) | ORAL | 0 refills | Status: DC | PRN

## 2020-05-07 ENCOUNTER — Other Ambulatory Visit: Payer: Self-pay

## 2020-05-07 DIAGNOSIS — Z79891 Long term (current) use of opiate analgesic: Secondary | ICD-10-CM

## 2020-05-07 MED ORDER — HYDROCODONE-ACETAMINOPHEN 10-325 MG PO TABS
1.0000 | ORAL_TABLET | Freq: Four times a day (QID) | ORAL | 0 refills | Status: DC | PRN
Start: 2020-05-07 — End: 2020-06-09

## 2020-06-09 ENCOUNTER — Other Ambulatory Visit: Payer: Self-pay

## 2020-06-09 DIAGNOSIS — N5201 Erectile dysfunction due to arterial insufficiency: Secondary | ICD-10-CM

## 2020-06-09 DIAGNOSIS — Z79891 Long term (current) use of opiate analgesic: Secondary | ICD-10-CM

## 2020-06-09 MED ORDER — HYDROCODONE-ACETAMINOPHEN 10-325 MG PO TABS
1.0000 | ORAL_TABLET | Freq: Four times a day (QID) | ORAL | 0 refills | Status: DC | PRN
Start: 2020-06-09 — End: 2020-07-07

## 2020-06-09 MED ORDER — SILDENAFIL CITRATE 50 MG PO TABS: 50.0000 mg | ORAL_TABLET | Freq: Every day | ORAL | 3 refills | Status: DC | PRN

## 2020-07-01 ENCOUNTER — Encounter: Payer: Self-pay | Admitting: Legal Medicine

## 2020-07-01 ENCOUNTER — Other Ambulatory Visit: Payer: Self-pay

## 2020-07-01 ENCOUNTER — Ambulatory Visit (INDEPENDENT_AMBULATORY_CARE_PROVIDER_SITE_OTHER): Payer: Medicare Other | Admitting: Legal Medicine

## 2020-07-01 VITALS — BP 112/60 | HR 54 | Temp 97.6°F | Ht 71.0 in | Wt 218.8 lb

## 2020-07-01 DIAGNOSIS — E1169 Type 2 diabetes mellitus with other specified complication: Secondary | ICD-10-CM

## 2020-07-01 DIAGNOSIS — E1159 Type 2 diabetes mellitus with other circulatory complications: Secondary | ICD-10-CM

## 2020-07-01 DIAGNOSIS — I152 Hypertension secondary to endocrine disorders: Secondary | ICD-10-CM

## 2020-07-01 DIAGNOSIS — I10 Essential (primary) hypertension: Secondary | ICD-10-CM

## 2020-07-01 DIAGNOSIS — Z79891 Long term (current) use of opiate analgesic: Secondary | ICD-10-CM

## 2020-07-01 DIAGNOSIS — E782 Mixed hyperlipidemia: Secondary | ICD-10-CM | POA: Diagnosis not present

## 2020-07-01 DIAGNOSIS — I69351 Hemiplegia and hemiparesis following cerebral infarction affecting right dominant side: Secondary | ICD-10-CM | POA: Diagnosis not present

## 2020-07-01 DIAGNOSIS — E669 Obesity, unspecified: Secondary | ICD-10-CM
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
## 2020-07-02 LAB — COMPREHENSIVE METABOLIC PANEL
ALT: 31 IU/L (ref 0–44)
AST: 21 IU/L (ref 0–40)
Albumin/Globulin Ratio: 1.6 (ref 1.2–2.2)
Albumin: 4.6 g/dL (ref 3.8–4.9)
Alkaline Phosphatase: 144 IU/L — ABNORMAL HIGH (ref 44–121)
BUN/Creatinine Ratio: 21 — ABNORMAL HIGH (ref 9–20)
BUN: 22 mg/dL (ref 6–24)
Bilirubin Total: 0.4 mg/dL (ref 0.0–1.2)
CO2: 25 mmol/L (ref 20–29)
Calcium: 9.4 mg/dL (ref 8.7–10.2)
Chloride: 104 mmol/L (ref 96–106)
Creatinine, Ser: 1.07 mg/dL (ref 0.76–1.27)
GFR calc Af Amer: 87 mL/min/{1.73_m2} (ref >59)
GFR calc non Af Amer: 76 mL/min/{1.73_m2} (ref >59)
Globulin, Total: 2.8 g/dL (ref 1.5–4.5)
Glucose: 126 mg/dL — ABNORMAL HIGH (ref 65–99)
Potassium: 4.3 mmol/L (ref 3.5–5.2)
Sodium: 144 mmol/L (ref 134–144)
Total Protein: 7.4 g/dL (ref 6.0–8.5)

## 2020-07-02 LAB — HEMOGLOBIN A1C
Est. average glucose Bld gHb Est-mCnc: 143 mg/dL
Hgb A1c MFr Bld: 6.6 % — ABNORMAL HIGH (ref 4.8–5.6)

## 2020-07-02 LAB — CBC WITH DIFFERENTIAL/PLATELET
Basophils Absolute: 0.1 10*3/uL (ref 0.0–0.2)
Basos: 1 %
EOS (ABSOLUTE): 0.2 10*3/uL (ref 0.0–0.4)
Eos: 2 %
Hematocrit: 45.2 % (ref 37.5–51.0)
Hemoglobin: 14.8 g/dL (ref 13.0–17.7)
Immature Grans (Abs): 0 10*3/uL (ref 0.0–0.1)
Immature Granulocytes: 0 %
Lymphocytes Absolute: 2.2 10*3/uL (ref 0.7–3.1)
Lymphs: 30 %
MCH: 29.4 pg (ref 26.6–33.0)
MCHC: 32.7 g/dL (ref 31.5–35.7)
MCV: 90 fL (ref 79–97)
Monocytes Absolute: 0.8 10*3/uL (ref 0.1–0.9)
Monocytes: 11 %
Neutrophils Absolute: 4 10*3/uL (ref 1.4–7.0)
Neutrophils: 56 %
Platelets: 301 10*3/uL (ref 150–450)
RBC: 5.03 x10E6/uL (ref 4.14–5.80)
RDW: 13.6 % (ref 11.6–15.4)
WBC: 7.2 10*3/uL (ref 3.4–10.8)

## 2020-07-02 LAB — LIPID PANEL
Chol/HDL Ratio: 4.9 ratio (ref 0.0–5.0)
Cholesterol, Total: 163 mg/dL (ref 100–199)
HDL: 33 mg/dL — ABNORMAL LOW (ref >39)
LDL Chol Calc (NIH): 97 mg/dL (ref 0–99)
Triglycerides: 191 mg/dL — ABNORMAL HIGH (ref 0–149)
VLDL Cholesterol Cal: 33 mg/dL (ref 5–40)

## 2020-07-02 LAB — CARDIOVASCULAR RISK ASSESSMENT

## 2020-07-02 NOTE — Progress Notes (Signed)
CBC normal, glucose 126, kidney tests normal, liver tests OK, A1c 6.6 good, triglycerides high watch diet lp

## 2020-07-07 ENCOUNTER — Other Ambulatory Visit: Payer: Self-pay

## 2020-07-07 DIAGNOSIS — Z79891 Long term (current) use of opiate analgesic: Secondary | ICD-10-CM

## 2020-07-07 MED ORDER — HYDROCODONE-ACETAMINOPHEN 10-325 MG PO TABS: 1.0000 | ORAL_TABLET | Freq: Four times a day (QID) | ORAL | 0 refills | Status: DC | PRN

## 2020-07-22 ENCOUNTER — Other Ambulatory Visit: Payer: Self-pay | Admitting: Legal Medicine

## 2020-07-22 DIAGNOSIS — I10 Essential (primary) hypertension: Secondary | ICD-10-CM

## 2020-08-04 LAB — HM DIABETES EYE EXAM

## 2020-08-05 DIAGNOSIS — H2513 Age-related nuclear cataract, bilateral: Secondary | ICD-10-CM | POA: Diagnosis not present

## 2020-08-05 DIAGNOSIS — E119 Type 2 diabetes mellitus without complications: Secondary | ICD-10-CM | POA: Diagnosis not present

## 2020-08-05 LAB — HM DIABETES EYE EXAM

## 2020-08-07 ENCOUNTER — Other Ambulatory Visit: Payer: Self-pay

## 2020-08-07 DIAGNOSIS — N5201 Erectile dysfunction due to arterial insufficiency: Secondary | ICD-10-CM

## 2020-08-07 DIAGNOSIS — Z79891 Long term (current) use of opiate analgesic: Secondary | ICD-10-CM

## 2020-08-07 MED ORDER — SILDENAFIL CITRATE 50 MG PO TABS: 50.0000 mg | ORAL_TABLET | Freq: Every day | ORAL | 3 refills | Status: DC | PRN

## 2020-08-07 MED ORDER — HYDROCODONE-ACETAMINOPHEN 10-325 MG PO TABS: 1.0000 | ORAL_TABLET | Freq: Four times a day (QID) | ORAL | 0 refills | Status: DC | PRN

## 2020-08-08 ENCOUNTER — Other Ambulatory Visit: Payer: Self-pay | Admitting: Legal Medicine

## 2020-09-09 ENCOUNTER — Other Ambulatory Visit: Payer: Self-pay

## 2020-09-09 DIAGNOSIS — Z79891 Long term (current) use of opiate analgesic: Secondary | ICD-10-CM

## 2020-09-09 DIAGNOSIS — N5201 Erectile dysfunction due to arterial insufficiency: Secondary | ICD-10-CM

## 2020-09-09 MED ORDER — HYDROCODONE-ACETAMINOPHEN 10-325 MG PO TABS: 1.0000 | ORAL_TABLET | Freq: Four times a day (QID) | ORAL | 0 refills | Status: DC | PRN

## 2020-09-09 MED ORDER — SILDENAFIL CITRATE 50 MG PO TABS: 50.0000 mg | ORAL_TABLET | Freq: Every day | ORAL | 3 refills | Status: DC | PRN

## 2020-10-07 ENCOUNTER — Other Ambulatory Visit: Payer: Self-pay

## 2020-10-07 DIAGNOSIS — Z79891 Long term (current) use of opiate analgesic: Secondary | ICD-10-CM

## 2020-10-07 DIAGNOSIS — N5201 Erectile dysfunction due to arterial insufficiency: Secondary | ICD-10-CM

## 2020-10-07 MED ORDER — HYDROCODONE-ACETAMINOPHEN 10-325 MG PO TABS: 1.0000 | ORAL_TABLET | Freq: Four times a day (QID) | ORAL | 0 refills | Status: DC | PRN

## 2020-10-07 MED ORDER — SILDENAFIL CITRATE 50 MG PO TABS: 50.0000 mg | ORAL_TABLET | Freq: Every day | ORAL | 3 refills | Status: DC | PRN

## 2020-10-20 ENCOUNTER — Other Ambulatory Visit: Payer: Self-pay | Admitting: Legal Medicine

## 2020-10-20 DIAGNOSIS — E669 Obesity, unspecified: Secondary | ICD-10-CM

## 2020-10-20 DIAGNOSIS — E1159 Type 2 diabetes mellitus with other circulatory complications: Secondary | ICD-10-CM

## 2020-10-31 ENCOUNTER — Telehealth: Payer: Self-pay | Admitting: Legal Medicine

## 2020-10-31 ENCOUNTER — Encounter: Payer: Self-pay | Admitting: Legal Medicine

## 2020-10-31 ENCOUNTER — Other Ambulatory Visit: Payer: Self-pay

## 2020-10-31 ENCOUNTER — Ambulatory Visit (INDEPENDENT_AMBULATORY_CARE_PROVIDER_SITE_OTHER): Payer: Medicare Other | Admitting: Legal Medicine

## 2020-10-31 ENCOUNTER — Telehealth: Payer: Self-pay

## 2020-10-31 VITALS — BP 120/74 | HR 72 | Temp 97.2°F | Ht 70.0 in | Wt 219.0 lb

## 2020-10-31 DIAGNOSIS — E1169 Type 2 diabetes mellitus with other specified complication: Secondary | ICD-10-CM | POA: Diagnosis not present

## 2020-10-31 DIAGNOSIS — I10 Essential (primary) hypertension: Secondary | ICD-10-CM | POA: Diagnosis not present

## 2020-10-31 DIAGNOSIS — E669 Obesity, unspecified: Secondary | ICD-10-CM

## 2020-10-31 DIAGNOSIS — E1159 Type 2 diabetes mellitus with other circulatory complications: Secondary | ICD-10-CM | POA: Diagnosis not present

## 2020-10-31 DIAGNOSIS — I69351 Hemiplegia and hemiparesis following cerebral infarction affecting right dominant side: Secondary | ICD-10-CM | POA: Diagnosis not present

## 2020-10-31 DIAGNOSIS — E119 Type 2 diabetes mellitus without complications: Secondary | ICD-10-CM

## 2020-10-31 DIAGNOSIS — I152 Hypertension secondary to endocrine disorders: Secondary | ICD-10-CM | POA: Diagnosis not present

## 2020-10-31 DIAGNOSIS — E782 Mixed hyperlipidemia: Secondary | ICD-10-CM | POA: Diagnosis not present

## 2020-10-31 NOTE — Chronic Care Management (AMB) (Signed)
°  Chronic Care Management   Note  10/31/2020 Name: Marcus Klein MRN: [id number] DOB: [birth year]  Marcus Klein is a 60 y.o. year old male who is a primary care patient of Lillard Anes, MD. I reached out to Mikel Cella by phone today in response to a referral sent by Marcus Klein's PCP, Lillard Anes, MD.   Marcus Klein was given information about Chronic Care Management services today including:  1. CCM service includes personalized support from designated clinical staff supervised by his physician, including individualized plan of care and coordination with other care providers 2. 24/7 contact phone numbers for assistance for urgent and routine care needs. 3. Service will only be billed when office clinical staff spend 20 minutes or more in a month to coordinate care. 4. Only one practitioner may furnish and bill the service in a calendar month. 5. The patient may stop CCM services at any time (effective at the end of the month) by phone call to the office staff.   Patient agreed to services and verbal consent obtained.   Follow up plan:   [place]
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
## 2020-10-31 NOTE — Telephone Encounter (Signed)
CCM referral

## 2020-11-01 LAB — CBC WITH DIFFERENTIAL/PLATELET
Basophils Absolute: 0.1 10*3/uL (ref 0.0–0.2)
Basos: 1 %
EOS (ABSOLUTE): 0.1 10*3/uL (ref 0.0–0.4)
Eos: 2 %
Hematocrit: 47.7 % (ref 37.5–51.0)
Hemoglobin: 16 g/dL (ref 13.0–17.7)
Immature Grans (Abs): 0 10*3/uL (ref 0.0–0.1)
Immature Granulocytes: 0 %
Lymphocytes Absolute: 2 10*3/uL (ref 0.7–3.1)
Lymphs: 29 %
MCH: 30 pg (ref 26.6–33.0)
MCHC: 33.5 g/dL (ref 31.5–35.7)
MCV: 89 fL (ref 79–97)
Monocytes Absolute: 0.8 10*3/uL (ref 0.1–0.9)
Monocytes: 12 %
Neutrophils Absolute: 3.9 10*3/uL (ref 1.4–7.0)
Neutrophils: 56 %
Platelets: 266 10*3/uL (ref 150–450)
RBC: 5.34 x10E6/uL (ref 4.14–5.80)
RDW: 13.4 % (ref 11.6–15.4)
WBC: 7 10*3/uL (ref 3.4–10.8)

## 2020-11-01 LAB — LIPID PANEL
Chol/HDL Ratio: 4.7 ratio (ref 0.0–5.0)
Cholesterol, Total: 177 mg/dL (ref 100–199)
HDL: 38 mg/dL — ABNORMAL LOW (ref >39)
LDL Chol Calc (NIH): 103 mg/dL — ABNORMAL HIGH (ref 0–99)
Triglycerides: 205 mg/dL — ABNORMAL HIGH (ref 0–149)
VLDL Cholesterol Cal: 36 mg/dL (ref 5–40)

## 2020-11-01 LAB — COMPREHENSIVE METABOLIC PANEL
ALT: 30 IU/L (ref 0–44)
AST: 21 IU/L (ref 0–40)
Albumin/Globulin Ratio: 1.6 (ref 1.2–2.2)
Albumin: 4.5 g/dL (ref 3.8–4.9)
Alkaline Phosphatase: 125 IU/L — ABNORMAL HIGH (ref 44–121)
BUN/Creatinine Ratio: 19 (ref 9–20)
BUN: 18 mg/dL (ref 6–24)
Bilirubin Total: 0.6 mg/dL (ref 0.0–1.2)
CO2: 26 mmol/L (ref 20–29)
Calcium: 9.4 mg/dL (ref 8.7–10.2)
Chloride: 97 mmol/L (ref 96–106)
Creatinine, Ser: 0.93 mg/dL (ref 0.76–1.27)
GFR calc Af Amer: 104 mL/min/{1.73_m2} (ref >59)
GFR calc non Af Amer: 90 mL/min/{1.73_m2} (ref >59)
Globulin, Total: 2.9 g/dL (ref 1.5–4.5)
Glucose: 146 mg/dL — ABNORMAL HIGH (ref 65–99)
Potassium: 4.2 mmol/L (ref 3.5–5.2)
Sodium: 139 mmol/L (ref 134–144)
Total Protein: 7.4 g/dL (ref 6.0–8.5)

## 2020-11-01 LAB — CARDIOVASCULAR RISK ASSESSMENT

## 2020-11-01 LAB — HEMOGLOBIN A1C
Est. average glucose Bld gHb Est-mCnc: 151 mg/dL
Hgb A1c MFr Bld: 6.9 % — ABNORMAL HIGH (ref 4.8–5.6)

## 2020-11-02 NOTE — Progress Notes (Signed)
CBC normal, glucose 146, kidney tests normal, liver tests norma, A1c 6.9 good, LDL cholesterol high, triglycerides high, use DASH diet lp

## 2020-11-03 ENCOUNTER — Encounter: Payer: Self-pay | Admitting: Legal Medicine

## 2020-11-04 ENCOUNTER — Other Ambulatory Visit: Payer: Self-pay

## 2020-11-04 DIAGNOSIS — Z79891 Long term (current) use of opiate analgesic: Secondary | ICD-10-CM

## 2020-11-04 MED ORDER — HYDROCODONE-ACETAMINOPHEN 10-325 MG PO TABS
1.0000 | ORAL_TABLET | Freq: Four times a day (QID) | ORAL | 0 refills | Status: DC | PRN
Start: 2020-11-04 — End: 2020-12-05

## 2020-11-07 ENCOUNTER — Other Ambulatory Visit: Payer: Self-pay

## 2020-11-07 DIAGNOSIS — N5201 Erectile dysfunction due to arterial insufficiency: Secondary | ICD-10-CM

## 2020-11-07 MED ORDER — SILDENAFIL CITRATE 50 MG PO TABS: 50.0000 mg | ORAL_TABLET | Freq: Every day | ORAL | 3 refills | Status: DC | PRN

## 2020-11-21 ENCOUNTER — Other Ambulatory Visit: Payer: Self-pay | Admitting: Legal Medicine

## 2020-12-05 ENCOUNTER — Other Ambulatory Visit: Payer: Self-pay

## 2020-12-05 DIAGNOSIS — Z79891 Long term (current) use of opiate analgesic: Secondary | ICD-10-CM

## 2020-12-05 MED ORDER — HYDROCODONE-ACETAMINOPHEN 10-325 MG PO TABS: 1.0000 | ORAL_TABLET | Freq: Four times a day (QID) | ORAL | 0 refills | Status: DC | PRN

## 2020-12-09 NOTE — Progress Notes (Deleted)
Chronic Care Management Pharmacy Note  12/09/2020 Name:  Marcus Klein MRN:  [id number] DOB:  1960/10/21  Subjective: Marcus Klein is an 60 y.o. year old male who is a primary patient of Henrene Pastor, Zeb Comfort, MD.  The CCM team was consulted for assistance with disease management and care coordination needs.    Engaged with patient by telephone for initial visit in response to provider referral for pharmacy case management and/or care coordination services.   Consent to Services:  The patient was given the following information about Chronic Care Management services today, agreed to services, and gave verbal consent: 1. CCM service includes personalized support from designated clinical staff supervised by the primary care provider, including individualized plan of care and coordination with other care providers 2. 24/7 contact phone numbers for assistance for urgent and routine care needs. 3. Service will only be billed when office clinical staff spend 20 minutes or more in a month to coordinate care. 4. Only one practitioner may furnish and bill the service in a calendar month. 5.The patient may stop CCM services at any time (effective at the end of the month) by phone call to the office staff. 6. The patient will be responsible for cost sharing (co-pay) of up to 20% of the service fee (after annual deductible is met). Patient agreed to services and consent obtained.  Patient Care Team: Lillard Anes, MD as PCP - General (Family Medicine) Burnice Logan, [facility] as Pharmacist (Pharmacist)  Recent office visits: 10/31/2020 - CBC normal, glucose 146, kidney tests normal, liver tests norma, A1c 6.9 good, LDL cholesterol high, triglycerides high, use DASH diet 07/01/2020 - CBC normal, glucose 126, kidney tests normal, liver tests OK, A1c 6.6 good, triglycerides high watch diet Recent consult visits: *** 08/05/2020 - eye exam.   Hospital visits: None in previous 6  months  Objective:  Lab Results  Component Value Date   CREATININE 0.93 10/31/2020   BUN 18 10/31/2020   GFRNONAA 90 10/31/2020   GFRAA 104 10/31/2020   NA 139 10/31/2020   K 4.2 10/31/2020   CALCIUM 9.4 10/31/2020   CO2 26 10/31/2020   GLUCOSE 146 (H) 10/31/2020    Lab Results  Component Value Date/Time   HGBA1C 6.9 (H) 10/31/2020 08:00 AM   HGBA1C 6.6 (H) 07/01/2020 08:07 AM   MICROALBUR 80 11/14/2019 08:17 AM    Last diabetic Eye exam: No results found for: HMDIABEYEEXA  Last diabetic Foot exam: No results found for: HMDIABFOOTEX   Lab Results  Component Value Date   CHOL 177 10/31/2020   HDL 38 (L) 10/31/2020   LDLCALC 103 (H) 10/31/2020   TRIG 205 (H) 10/31/2020   CHOLHDL 4.7 10/31/2020    Hepatic Function Latest Ref Rng & Units 10/31/2020 07/01/2020 03/05/2020  Total Protein 6.0 - 8.5 g/dL 7.4 7.4 7.5  Albumin 3.8 - 4.9 g/dL 4.5 4.6 4.5  AST 0 - 40 IU/L _0 ALT 0 - 44 IU/L 30 31 35  Alk Phosphatase 44 - 121 IU/L 125(H) 144(H) 109  Total Bilirubin 0.0 - 1.2 mg/dL 0.6 0.4 0.6    No results found for: TSH, FREET4  CBC Latest Ref Rng & Units 10/31/2020 07/01/2020 03/05/2020  WBC 3.4 - 10.8 x10E3/uL 7.0 7.2 8.1  Hemoglobin 13.0 - 17.7 g/dL 16.0 14.8 15.7  Hematocrit 37.5 - 51.0 % 47.7 45.2 47.6  Platelets 150 - 450 x10E3/uL 266 301 267    No results found for: VD25OH  Clinical ASCVD:  Yes  The 10-year ASCVD risk score Mikey Bussing DC Jr., et al., 2013) is: 16.7%   Values used to calculate the score:     Age: 75 years     Sex: Male     Is Non-Hispanic African American: No     Diabetic: Yes     Tobacco smoker: No     Systolic Blood Pressure: 078 mmHg     Is BP treated: Yes     HDL Cholesterol: 38 mg/dL     Total Cholesterol: 177 mg/dL    No flowsheet data found.   ***Other: (CHADS2VASc if Afib, MMRC or CAT for COPD, ACT, DEXA)  Social History   Tobacco Use  Smoking Status Never Smoker  Smokeless Tobacco Never Used   BP Readings from Last 3  Encounters:  10/31/20 120/74  07/01/20 112/60  03/05/20 110/70   Pulse Readings from Last 3 Encounters:  10/31/20 72  07/01/20 (!) 54  03/05/20 63   Wt Readings from Last 3 Encounters:  10/31/20 219 lb (99.3 kg)  07/01/20 218 lb 12.8 oz (99.2 kg)  03/05/20 223 lb (101.2 kg)   BMI Readings from Last 3 Encounters:  10/31/20 31.42 kg/m  07/01/20 30.52 kg/m  03/05/20 31.10 kg/m    Assessment/Interventions: Review of patient past medical history, allergies, medications, health status, including review of consultants reports, laboratory and other test data, was performed as part of comprehensive evaluation and provision of chronic care management services.   SDOH:  (Social Determinants of Health) assessments and interventions performed: Yes  SDOH Screenings   Alcohol Screen: Not on file  Depression (MLJ4-4): Not on file  Financial Resource Strain: Not on file  Food Insecurity: Not on file  Housing: Not on file  Physical Activity: Not on file  Social Connections: Not on file  Stress: Not on file  Tobacco Use: Low Risk   . Smoking Tobacco Use: Never Smoker  . Smokeless Tobacco Use: Never Used  Transportation Needs: Not on file    CCM Care Plan  Allergies  Allergen Reactions  . Tramadol Other (See Comments)    Medications Reviewed Today    Reviewed by Lillard Anes, MD (Physician) on 10/31/20 at 0744  Med List Status: <None>  Medication Order Taking? Sig Documenting Provider Last Dose Status Informant  atorvastatin (LIPITOR) 80 MG tablet [id number] Yes TAKE 1 TABLET BY MOUTH  DAILY Lillard Anes, MD Taking Active   clopidogrel (PLAVIX) 75 MG tablet [id number] Yes TAKE 1 TABLET BY MOUTH  DAILY Lillard Anes, MD Taking Active   hydrochlorothiazide (HYDRODIURIL) 25 MG tablet [id number] Yes TAKE 1 TABLET BY MOUTH  DAILY Lillard Anes, MD Taking Active   HYDROcodone-acetaminophen [facility]) 10-325 MG tablet [id number] Yes Take 1 tablet by mouth  every 6 (six) hours as needed. Lillard Anes, MD Taking Active   JANUMET XR [phone] MG TB24 [id number] Yes TAKE 1 TABLET BY MOUTH  DAILY Lillard Anes, MD Taking Active   lisinopril (ZESTRIL) 10 MG tablet [id number] Yes TAKE 1 TABLET BY MOUTH  DAILY Lillard Anes, MD Taking Active   metoprolol succinate (TOPROL-XL) 50 MG 24 hr tablet [id number] Yes TAKE 1 TABLET BY MOUTH  TWICE DAILY Lillard Anes, MD Taking Active   sildenafil (VIAGRA) 50 MG tablet [id number] Yes Take 1 tablet (50 mg total) by mouth daily as needed. Lillard Anes, MD Taking Active           Patient Active Problem List   Diagnosis Date Noted  .  Type 2 diabetes mellitus with vascular disease ([place]) 10/31/2020  . Essential hypertension, benign 07/01/2020  . Obesity, diabetes, and hypertension syndrome ([place]) 11/13/2019  . Mixed hyperlipidemia 11/13/2019  . Osteoarthritis of knee, unspecified 11/13/2019  . Hemiparesis affecting right side as late effect of cerebrovascular accident ([place]) 11/13/2019  . Erectile dysfunction due to arterial insufficiency 11/13/2019  . Encounter for long-term opiate analgesic use 11/13/2019  . Expected blood loss anemia 08/02/2012  . Obese 08/02/2012  . Hyponatremia 08/02/2012  . S/P right TKA 08/01/2012  . S/P Hardware removal right knee 06/20/2012     There is no immunization history on file for this patient.  Conditions to be addressed/monitored:  Hypertension, Hyperlipidemia, Diabetes, Osteoarthritis and erectile dysfunction  There are no care plans that you recently modified to display for this patient.    Medication Assistance: {MEDASSISTANCEINFO:25044}  Patient's preferred pharmacy is:  [facility] ([place]) [place], [place] [address] Phone: [phone] Fax: [phone]  [facility] [place], [place] - [address] AT [address] [place] [address] Phone: [phone] Fax: [phone]  [place], [address], [address] [place], [place] [address] Phone: [phone] Fax: [place], [place] [address] [place] [address] Phone: [phone] Fax: [phone]  Uses pill box? {Yes or If no, why not?:20788} Pt endorses ***% compliance  We discussed: Current pharmacy is preferred with insurance plan and patient is satisfied with pharmacy services Patient decided to: Continue current medication management strategy  Care Plan and Follow Up Patient Decision:  Patient agrees to Care Plan and Follow-up.  Plan: Telephone follow up appointment with care management team member scheduled for:  ***  ***    Current Barriers:  . {pharmacybarriers:24917} . ***  Pharmacist Clinical Goal(s):  Marland Kitchen Patient will {PHARMACYGOALCHOICES:24921} through collaboration with PharmD and provider.  . ***  Interventions: . 1:1 collaboration with Lillard Anes, MD regarding development and update of comprehensive plan of care as evidenced by provider attestation and co-signature . Inter-disciplinary care team collaboration (see longitudinal plan of care) . Comprehensive medication review performed; medication list updated in electronic medical record  Hypertension (BP goal <130/80) -{US controlled/uncontrolled:25276} -Current treatment: . ***hydrochlorothiazide 25 mg daily . Lisinopril 10 mg daily . Metoprolol succinate 50 mg bid -Medications previously tried: ***  -Current home readings: *** -Current dietary habits: *** -Current exercise habits: *** -{ACTIONS;DENIES/REPORTS:21021675::"Denies"} hypotensive/hypertensive symptoms -Educated on {CCM BP Counseling:25124} -Counseled to monitor BP at home ***, document, and provide log at future  appointments -{CCMPHARMDINTERVENTION:25122}  Hyperlipidemia: (LDL goal < ***) -{US controlled/uncontrolled:25276} -Current treatment: . ***atorvastatin 80 mg daily  . Clopidogrel 75 mg daily  -Medications previously tried: ***  -Current dietary patterns: *** -Current exercise habits: *** -Educated on {CCM HLD Counseling:25126} -{CCMPHARMDINTERVENTION:25122}  Diabetes (A1c goal {A1c goals:23924}) -{US controlled/uncontrolled:25276} -Current medications: . ***Janumet XR [phone] mg daily -Medications previously tried: ***  -Current home glucose readings . fasting glucose: *** . post prandial glucose: *** -{ACTIONS;DENIES/REPORTS:21021675::"Denies"} hypoglycemic/hyperglycemic symptoms -Current meal patterns:  . breakfast: ***  . lunch: ***  . dinner: *** . snacks: *** . drinks: *** -Current exercise: *** -Educated on {CCM DM COUNSELING:25123} -Counseled to check feet daily and get yearly eye exams -{CCMPHARMDINTERVENTION:25122}  *** (Goal: ***) -{US controlled/uncontrolled:25276} -Current treatment  . ***hydrocodone-acetaminophen 10-325 mg every  6 hours prn  -Medications previously tried: ***  -{CCMPHARMDINTERVENTION:25122}  Health Maintenance -Vaccine gaps: *** -Current therapy:  . ***sildenafil 50 mg daily prn  -Educated on {ccm supplement counseling:25128} -{CCM Patient satisfied:25129} -{CCMPHARMDINTERVENTION:25122}   Patient Goals/Self-Care Activities . Patient will:  - {pharmacypatientgoals:24919}  Follow Up Plan: Telephone follow up appointment with care management team member scheduled for:

## 2020-12-12 ENCOUNTER — Telehealth: Payer: Self-pay

## 2020-12-12 NOTE — Progress Notes (Signed)
    Chronic Care Management Pharmacy Assistant   Name: [name]  MRN: [id number] DOB: Feb 05, 1961  Marcus Klein is an 60 y.o. year old male who presents for his initial CCM visit with the clinical pharmacist.  Reason for Encounter: Initial call questions   Recent office visits:   10/31/2020: Lillard Anes, MD (PCP) . No medication changes noted.  07/02/2019: Lillard Anes, MD (PCP) No medication changes noted.    Recent consult visits:  08/05/2020: Valente David, Optometry/ no medication changes noted.  Hospital visits:  No hospital visits noted within the past 6 months.  Medications: Outpatient Encounter Medications as of 12/12/2020  Medication Sig  . atorvastatin (LIPITOR) 80 MG tablet TAKE 1 TABLET BY MOUTH  DAILY  . clopidogrel (PLAVIX) 75 MG tablet TAKE 1 TABLET BY MOUTH  DAILY  . hydrochlorothiazide (HYDRODIURIL) 25 MG tablet TAKE 1 TABLET BY MOUTH  DAILY  . HYDROcodone-acetaminophen (NORCO) 10-325 MG tablet Take 1 tablet by mouth every 6 (six) hours as needed.  Marland Kitchen JANUMET XR [phone] MG TB24 TAKE 1 TABLET BY MOUTH  DAILY  . lisinopril (ZESTRIL) 10 MG tablet TAKE 1 TABLET BY MOUTH  DAILY  . metoprolol succinate (TOPROL-XL) 50 MG 24 hr tablet TAKE 1 TABLET BY MOUTH  TWICE DAILY  . sildenafil (VIAGRA) 50 MG tablet Take 1 tablet (50 mg total) by mouth daily as needed.   No facility-administered encounter medications on file as of 12/12/2020.   Have you seen any other providers since your last visit?  Patient stated that he has not seen any other providers since last visit.  Any changes in your medications or health?  Patient reported no changes in medications or health.   Any side effects from any medications?  Patient reports no side effects from any medications.   Do you have an symptoms or problems not managed by your medications? Patient stated no symptoms or problems not managed by medications.   Any concerns about your health right now?   Patient reported he does not have any health concerns at this time.   Has your provider asked that you check blood pressure, blood sugar, or follow special diet at home?  Patient reported that he does not check his blood pressure at home. He stated that his sugar was 128 this morning before breakfast, and he reported this is an average fasting reading for him. He stated he does not follow a special diet at home.   Do you get any type of exercise on a regular basis?  Patient stated he does not exercise.    Can you think of a goal you would like to reach for your health?  Patient stated he does not have any health goals at this time.  Do you have any problems getting your medications?  Patient stated that he has not problems getting his medications.   Is there anything that you would like to discuss during the appointment?  Patient stated that he does not have anything that he would like to discuss at this time.   Please bring medications and supplements to appointment   Marcine Matar, [place] Clinical Pharmacist Assistant

## 2020-12-15 DIAGNOSIS — E118 Type 2 diabetes mellitus with unspecified complications: Secondary | ICD-10-CM | POA: Diagnosis not present

## 2020-12-16 ENCOUNTER — Telehealth: Payer: Medicare Other

## 2020-12-23 ENCOUNTER — Telehealth: Payer: Self-pay

## 2020-12-23 NOTE — Progress Notes (Signed)
    Chronic Care Management Pharmacy Assistant   Name: TYMERE DEPUY  MRN: [id number] DOB: May 19, 1961    Entered in Error    IQ and prep done by Marcine Matar 12/12/20  Clarita Leber, [place] Pharmacist Assistant [phone]

## 2020-12-25 NOTE — Progress Notes (Signed)
Chronic Care Management Pharmacy Note  12/25/2020 Name:  Marcus Klein MRN:  [id number] DOB:  March 02, 1961   Plan Updates:   Discussed blood sugar management with patient. Patient reports that he would like to lose some weight. Pharmacist counseled on the option of GLP-1. Patient is not ready to consider at this time. Will revisit after updated blood work.   Subjective: Marcus Klein is an 60 y.o. year old male who is a primary patient of Henrene Pastor, Zeb Comfort, MD.  The CCM team was consulted for assistance with disease management and care coordination needs.    Engaged with patient by telephone for initial visit in response to provider referral for pharmacy case management and/or care coordination services.   Consent to Services:  The patient was given the following information about Chronic Care Management services today, agreed to services, and gave verbal consent: 1. CCM service includes personalized support from designated clinical staff supervised by the primary care provider, including individualized plan of care and coordination with other care providers 2. 24/7 contact phone numbers for assistance for urgent and routine care needs. 3. Service will only be billed when office clinical staff spend 20 minutes or more in a month to coordinate care. 4. Only one practitioner may furnish and bill the service in a calendar month. 5.The patient may stop CCM services at any time (effective at the end of the month) by phone call to the office staff. 6. The patient will be responsible for cost sharing (co-pay) of up to 20% of the service fee (after annual deductible is met). Patient agreed to services and consent obtained.  Patient Care Team: Lillard Anes, MD as PCP - General (Family Medicine) Burnice Logan, [facility] as Pharmacist (Pharmacist)  Recent office visits: 10/31/2020 - CBC normal, glucose 146, kidney tests normal, liver tests norma, A1c 6.9 good, LDL cholesterol high,  triglycerides high, use DASH diet 07/01/2020 - CBC normal, glucose 126, kidney tests normal, liver tests OK, A1c 6.6 good, triglycerides high watch diet Recent consult visits: 08/05/2020 - eye exam.   Hospital visits: None in previous 6 months  Objective:  Lab Results  Component Value Date   CREATININE 0.93 10/31/2020   BUN 18 10/31/2020   GFRNONAA 90 10/31/2020   GFRAA 104 10/31/2020   NA 139 10/31/2020   K 4.2 10/31/2020   CALCIUM 9.4 10/31/2020   CO2 26 10/31/2020   GLUCOSE 146 (H) 10/31/2020    Lab Results  Component Value Date/Time   HGBA1C 6.9 (H) 10/31/2020 08:00 AM   HGBA1C 6.6 (H) 07/01/2020 08:07 AM   MICROALBUR 80 11/14/2019 08:17 AM    Last diabetic Eye exam: No results found for: HMDIABEYEEXA  Last diabetic Foot exam: No results found for: HMDIABFOOTEX   Lab Results  Component Value Date   CHOL 177 10/31/2020   HDL 38 (L) 10/31/2020   LDLCALC 103 (H) 10/31/2020   TRIG 205 (H) 10/31/2020   CHOLHDL 4.7 10/31/2020    Hepatic Function Latest Ref Rng & Units 10/31/2020 07/01/2020 03/05/2020  Total Protein 6.0 - 8.5 g/dL 7.4 7.4 7.5  Albumin 3.8 - 4.9 g/dL 4.5 4.6 4.5  AST 0 - 40 IU/L 21 21 20   ALT 0 - 44 IU/L 30 31 35  Alk Phosphatase 44 - 121 IU/L 125(H) 144(H) 109  Total Bilirubin 0.0 - 1.2 mg/dL 0.6 0.4 0.6    No results found for: TSH, FREET4  CBC Latest Ref Rng & Units 10/31/2020 07/01/2020 03/05/2020  WBC 3.4 -  10.8 x10E3/uL 7.0 7.2 8.1  Hemoglobin 13.0 - 17.7 g/dL 16.0 14.8 15.7  Hematocrit 37.5 - 51.0 % 47.7 45.2 47.6  Platelets 150 - 450 x10E3/uL 266 301 267    No results found for: VD25OH  Clinical ASCVD: No  The 10-year ASCVD risk score Mikey Bussing DC Jr., et al., 2013) is: 16.7%   Values used to calculate the score:     Age: 21 years     Sex: Male     Is Non-Hispanic African American: No     Diabetic: Yes     Tobacco smoker: No     Systolic Blood Pressure: 315 mmHg     Is BP treated: Yes     HDL Cholesterol: 38 mg/dL     Total  Cholesterol: 177 mg/dL    No flowsheet data found.    Social History   Tobacco Use  Smoking Status Never Smoker  Smokeless Tobacco Never Used   BP Readings from Last 3 Encounters:  10/31/20 120/74  07/01/20 112/60  03/05/20 110/70   Pulse Readings from Last 3 Encounters:  10/31/20 72  07/01/20 (!) 54  03/05/20 63   Wt Readings from Last 3 Encounters:  10/31/20 219 lb (99.3 kg)  07/01/20 218 lb 12.8 oz (99.2 kg)  03/05/20 223 lb (101.2 kg)   BMI Readings from Last 3 Encounters:  10/31/20 31.42 kg/m  07/01/20 30.52 kg/m  03/05/20 31.10 kg/m    Assessment/Interventions: Review of patient past medical history, allergies, medications, health status, including review of consultants reports, laboratory and other test data, was performed as part of comprehensive evaluation and provision of chronic care management services.   SDOH:  (Social Determinants of Health) assessments and interventions performed: Yes  SDOH Screenings   Alcohol Screen: Not on file  Depression (QMG8-6): Not on file  Financial Resource Strain: Not on file  Food Insecurity: Not on file  Housing: Not on file  Physical Activity: Not on file  Social Connections: Not on file  Stress: Not on file  Tobacco Use: Low Risk   . Smoking Tobacco Use: Never Smoker  . Smokeless Tobacco Use: Never Used  Transportation Needs: Not on file    CCM Care Plan  Allergies  Allergen Reactions  . Tramadol Other (See Comments)    Medications Reviewed Today    Reviewed by Lillard Anes, MD (Physician) on 10/31/20 at 0744  Med List Status: <None>  Medication Order Taking? Sig Documenting Provider Last Dose Status Informant  atorvastatin (LIPITOR) 80 MG tablet [id number] Yes TAKE 1 TABLET BY MOUTH  DAILY Lillard Anes, MD Taking Active   clopidogrel (PLAVIX) 75 MG tablet [id number] Yes TAKE 1 TABLET BY MOUTH  DAILY Lillard Anes, MD Taking Active   hydrochlorothiazide (HYDRODIURIL) 25 MG tablet  [id number] Yes TAKE 1 TABLET BY MOUTH  DAILY Lillard Anes, MD Taking Active   HYDROcodone-acetaminophen [facility]) 10-325 MG tablet [id number] Yes Take 1 tablet by mouth every 6 (six) hours as needed. Lillard Anes, MD Taking Active   JANUMET XR [phone] MG TB24 [id number] Yes TAKE 1 TABLET BY MOUTH  DAILY Lillard Anes, MD Taking Active   lisinopril (ZESTRIL) 10 MG tablet [id number] Yes TAKE 1 TABLET BY MOUTH  DAILY Lillard Anes, MD Taking Active   metoprolol succinate (TOPROL-XL) 50 MG 24 hr tablet [id number] Yes TAKE 1 TABLET BY MOUTH  TWICE DAILY Lillard Anes, MD Taking Active   sildenafil (VIAGRA) 50 MG tablet [id number] Yes Take 1 tablet (50  mg total) by mouth daily as needed. Lillard Anes, MD Taking Active           Patient Active Problem List   Diagnosis Date Noted  . Type 2 diabetes mellitus with vascular disease ([place]) 10/31/2020  . Essential hypertension, benign 07/01/2020  . Obesity, diabetes, and hypertension syndrome ([place]) 11/13/2019  . Mixed hyperlipidemia 11/13/2019  . Osteoarthritis of knee, unspecified 11/13/2019  . Hemiparesis affecting right side as late effect of cerebrovascular accident ([place]) 11/13/2019  . Erectile dysfunction due to arterial insufficiency 11/13/2019  . Encounter for long-term opiate analgesic use 11/13/2019  . Expected blood loss anemia 08/02/2012  . Obese 08/02/2012  . Hyponatremia 08/02/2012  . S/P right TKA 08/01/2012  . S/P Hardware removal right knee 06/20/2012     There is no immunization history on file for this patient.  Conditions to be addressed/monitored:  Hypertension, Hyperlipidemia, Diabetes, Osteoarthritis and erectile dysfunction  There are no care plans that you recently modified to display for this patient.    Medication Assistance: None required.  Patient affirms current coverage meets needs.  Patient's preferred pharmacy is:  [facility] [place], [place] [address] Phone: [phone] Fax: [phone]  [facility] [place], [place] - [address] [place] [address] Phone: [phone] Fax: [phone]  [place], [address], [address] [place], [place] [address] Phone: [phone] Fax: [place], [place] [address] [place] [address] Phone: [phone] Fax: [phone]  Uses pill box? No - stores medications together  Pt endorses good compliance  We discussed: Current pharmacy is preferred with insurance plan and patient is satisfied with pharmacy services Patient decided to: Continue current medication management strategy  Care Plan and Follow Up Patient Decision:  Patient agrees to Care Plan and Follow-up.  Plan: Telephone follow up appointment with care management team member scheduled for:  03/2021

## 2020-12-31 ENCOUNTER — Other Ambulatory Visit: Payer: Self-pay

## 2020-12-31 ENCOUNTER — Ambulatory Visit (INDEPENDENT_AMBULATORY_CARE_PROVIDER_SITE_OTHER): Payer: Medicare Other

## 2020-12-31 DIAGNOSIS — E782 Mixed hyperlipidemia: Secondary | ICD-10-CM

## 2020-12-31 DIAGNOSIS — E1159 Type 2 diabetes mellitus with other circulatory complications: Secondary | ICD-10-CM

## 2020-12-31 DIAGNOSIS — I10 Essential (primary) hypertension: Secondary | ICD-10-CM

## 2021-01-01 NOTE — Patient Instructions (Addendum)
Visit Information  Thank you for your time discussing your medications. I look forward to working with you to achieve your health care goals. Below is a summary of what we talked about during our visit.   Goals Addressed            This Visit's Progress   . Learn More About My Health       Timeframe:  Long-Range Goal Priority:  High Start Date:                             Expected End Date:                        Follow Up Date 03/2021   - tell my story and reason for my visit - repeat what I heard to make sure I understand - bring a list of my medicines to the visit - speak up when I don't understand    Why is this important?    The best way to learn about your health and care is by talking to the doctor and nurse.   They will answer your questions and give you information in the way that you like best.    Notes:     Marland Kitchen Monitor and Manage My Blood Sugar-Diabetes Type 2       Timeframe:  Long-Range Goal Priority:  High Start Date:                             Expected End Date:                       Follow Up Date 03/2021   - check blood sugar at prescribed times    Why is this important?    Checking your blood sugar at home helps to keep it from getting very high or very low.   Writing the results in a diary or log helps the doctor know how to care for you.   Your blood sugar log should have the time, date and the results.   Also, write down the amount of insulin or other medicine that you take.   Other information, like what you ate, exercise done and how you were feeling, will also be helpful.     Notes:     . Track and Manage My Blood Pressure-Hypertension       Timeframe:  Long-Range Goal Priority:  High Start Date:                             Expected End Date:                       Follow Up Date 03/2021   - write blood pressure results in a log or diary    Why is this important?    You won't feel high blood pressure, but it can still hurt your  blood vessels.   High blood pressure can cause heart or kidney problems. It can also cause a stroke.   Making lifestyle changes like losing a little weight or eating less salt will help.   Checking your blood pressure at home and at different times of the day can help to control blood pressure.   If the doctor prescribes medicine remember to take  it the way the doctor ordered.   Call the office if you cannot afford the medicine or if there are questions about it.     Notes:        Patient Care Plan: CCM Pharmacy Care Plan    Problem Identified: htn, hld, dm   Priority: High  Onset Date: 01/02/2021    Long-Range Goal: Disease State Management   Start Date: 01/02/2021  Expected End Date: 01/02/2022  This Visit's Progress: On track  Priority: High  Note:     Current Barriers:  . Unable to achieve control of cholesterol   Pharmacist Clinical Goal(s):  Marland Kitchen Patient will achieve control of cholseterol as evidenced by lipid panel through collaboration with PharmD and provider.   Interventions: . 1:1 collaboration with [name], MD regarding development and update of comprehensive plan of care as evidenced by provider attestation and co-signature . Inter-disciplinary care team collaboration (see longitudinal plan of care) . Comprehensive medication review performed; medication list updated in electronic medical record  Hypertension (BP goal <130/80) -Controlled -Current treatment: . hydrochlorothiazide 25 mg daily . Lisinopril 10 mg daily . Metoprolol succinate 50 mg bid -Medications previously tried: none reported  -Current home readings: not checking  -Current dietary habits: fried seafood, eats at home during the week -Current exercise habits: limited to yard work -Denies hypotensive/hypertensive symptoms -Educated on BP goals and benefits of medications for prevention of heart attack, stroke and kidney damage; Daily salt intake goal < 2300 mg; -Counseled to  monitor BP at home as needed, document, and provide log at future appointments -Counseled on diet and exercise extensively Recommended to continue current medication  Hyperlipidemia: (LDL goal < 70)  -Current treatment: . atorvastatin 80 mg daily  . Clopidogrel 75 mg daily  -Medications previously tried: none reported  -Current dietary patterns: no true "diet". Eats at home during the week mostly and enjoys fried seafood out on weekends.  -Current exercise habits: limited -Educated on Cholesterol goals;  Benefits of statin for ASCVD risk reduction; Importance of limiting foods high in cholesterol; -Counseled on diet and exercise extensively Recommended to continue current medication  Diabetes (A1c goal <7%) -Controlled -Current medications: . Janumet XR [phone] mg daily -Medications previously tried: none reported -Current home glucose readings . fasting glucose: 129 mg/dL . post prandial glucose: not reported -Denies hypoglycemic/hyperglycemic symptoms -Current meal patterns:  . breakfast: doesn't each much breakfast . lunch: eats at home normally  . dinner: seafood . snacks: none reported . drinks: diet green tea, coffee -Current exercise: limited  -Educated on A1c and blood sugar goals; Complications of diabetes including kidney damage, retinal damage, and cardiovascular disease; Benefits of weight loss; Benefits of routine self-monitoring of blood sugar; Carbohydrate counting and/or plate method -Counseled to check feet daily and get yearly eye exams -Counseled on diet and exercise extensively Educated on benefits of weight loss and option of GLP-1 to help with weight loss. Patient will let pharmacist or Dr. Marina Goodell know if he is interested.   Pain (Goal: manage symptoms of pain ) -Controlled -Current treatment  . hydrocodone-acetaminophen 10-325 mg every 6 hours prn  -Medications previously tried: tramadol  -Recommended to continue current medication  Health  Maintenance -Vaccine gaps: assess at future visits -Current therapy:  . sildenafil 50 mg daily prn  -Patient is satisfied with current therapy and denies issues -Recommended to continue current medication   Patient Goals/Self-Care Activities . Patient will:  - take medications as prescribed focus on medication adherence by using pill  box check glucose daily, document, and provide at future appointments engage in dietary modifications by limiting carbohydrate intake.   Follow Up Plan: Telephone follow up appointment with care management team member scheduled for: 03/2021      Mr. Gallo was given information about Chronic Care Management services today including:  1. CCM service includes personalized support from designated clinical staff supervised by his physician, including individualized plan of care and coordination with other care providers 2. 24/7 contact phone numbers for assistance for urgent and routine care needs. 3. Standard insurance, coinsurance, copays and deductibles apply for chronic care management only during months in which we provide at least 20 minutes of these services. Most insurances cover these services at 100%, however patients may be responsible for any copay, coinsurance and/or deductible if applicable. This service may help you avoid the need for more expensive face-to-face services. 4. Only one practitioner may furnish and bill the service in a calendar month. 5. The patient may stop CCM services at any time (effective at the end of the month) by phone call to the office staff.  Patient agreed to services and verbal consent obtained.   The patient verbalized understanding of instructions, educational materials, and care plan provided today and declined offer to receive copy of patient instructions, educational materials, and care plan.  Telephone follow up appointment with pharmacy team member scheduled for: 03/2021  Juliane Lack, PharmD Clinical  Pharmacist [facility] [phone] (office) [phone] (mobile)  [email or web address]">  DASH Eating Plan DASH stands for Dietary Approaches to Stop Hypertension. The DASH eating plan is a healthy eating plan that has been shown to:  Reduce high blood pressure (hypertension).  Reduce your risk for type 2 diabetes, heart disease, and stroke.  Help with weight loss. What are tips for following this plan? Reading food labels  Check food labels for the amount of salt (sodium) per serving. Choose foods with less than 5 percent of the Daily Value of sodium. Generally, foods with less than 300 milligrams (mg) of sodium per serving fit into this eating plan.  To find whole grains, look for the word "whole" as the first word in the ingredient list. Shopping  Buy products labeled as "low-sodium" or "no salt added."  Buy fresh foods. Avoid canned foods and pre-made or frozen meals. Cooking  Avoid adding salt when cooking. Use salt-free seasonings or herbs instead of table salt or sea salt. Check with your health care provider or pharmacist before using salt substitutes.  Do not fry foods. Cook foods using healthy methods such as baking, boiling, grilling, roasting, and broiling instead.  Cook with heart-healthy oils, such as olive, canola, avocado, soybean, or sunflower oil. Meal planning  Eat a balanced diet that includes: ? 4 or more servings of fruits and 4 or more servings of vegetables each day. Try to fill one-half of your plate with fruits and vegetables. ? 6-8 servings of whole grains each day. ? Less than 6 oz (170 g) of lean meat, poultry, or fish each day. A 3-oz (85-g) serving of meat is about the same size as a deck of cards. One egg equals 1 oz (28 g). ? 2-3 servings of low-fat dairy each day. One serving is 1 cup (237 mL). ? 1 serving of nuts, seeds, or beans 5 times each week. ? 2-3 servings of heart-healthy fats.  Healthy fats called omega-3 fatty acids are found in foods such as walnuts, flaxseeds, fortified milks, and eggs. These fats are also found  in cold-water fish, such as sardines, salmon, and mackerel.  Limit how much you eat of: ? Canned or prepackaged foods. ? Food that is high in trans fat, such as some fried foods. ? Food that is high in saturated fat, such as fatty meat. ? Desserts and other sweets, sugary drinks, and other foods with added sugar. ? Full-fat dairy products.  Do not salt foods before eating.  Do not eat more than 4 egg yolks a week.  Try to eat at least 2 vegetarian meals a week.  Eat more home-cooked food and less restaurant, buffet, and fast food.   Lifestyle  When eating at a restaurant, ask that your food be prepared with less salt or no salt, if possible.  If you drink alcohol: ? Limit how much you use to:  0-1 drink a day for women who are not pregnant.  0-2 drinks a day for men. ? Be aware of how much alcohol is in your drink. In the [place] one drink equals one 12 oz bottle of beer (355 mL), one 5 oz glass of wine (148 mL), or one 1 oz glass of hard liquor (44 mL). General information  Avoid eating more than 2,300 mg of salt a day. If you have hypertension, you may need to reduce your sodium intake to 1,500 mg a day.  Work with your health care provider to maintain a healthy body weight or to lose weight. Ask what an ideal weight is for you.  Get at least 30 minutes of exercise that causes your heart to beat faster (aerobic exercise) most days of the week. Activities may include walking, swimming, or biking.  Work with your health care provider or dietitian to adjust your eating plan to your individual calorie needs. What foods should I eat? Fruits All fresh, dried, or frozen fruit. Canned fruit in natural juice (without added sugar). Vegetables Fresh or frozen vegetables (raw, steamed, roasted, or grilled). Low-sodium or reduced-sodium tomato and  vegetable juice. Low-sodium or reduced-sodium tomato sauce and tomato paste. Low-sodium or reduced-sodium canned vegetables. Grains Whole-grain or whole-wheat bread. Whole-grain or whole-wheat pasta. Sharonann Malbrough rice. Modena Morrow. Bulgur. Whole-grain and low-sodium cereals. Pita bread. Low-fat, low-sodium crackers. Whole-wheat flour tortillas. Meats and other proteins Skinless chicken or [place]. Ground chicken or [place]. Pork with fat trimmed off. Fish and seafood. Egg whites. Dried beans, peas, or lentils. Unsalted nuts, nut butters, and seeds. Unsalted canned beans. Lean cuts of beef with fat trimmed off. Low-sodium, lean precooked or cured meat, such as sausages or meat loaves. Dairy Low-fat (1%) or fat-free (skim) milk. Reduced-fat, low-fat, or fat-free cheeses. Nonfat, low-sodium ricotta or cottage cheese. Low-fat or nonfat yogurt. Low-fat, low-sodium cheese. Fats and oils Soft margarine without trans fats. Vegetable oil. Reduced-fat, low-fat, or light mayonnaise and salad dressings (reduced-sodium). Canola, safflower, olive, avocado, soybean, and sunflower oils. Avocado. Seasonings and condiments Herbs. Spices. Seasoning mixes without salt. Other foods Unsalted popcorn and pretzels. Fat-free sweets. The items listed above may not be a complete list of foods and beverages you can eat. Contact a dietitian for more information. What foods should I avoid? Fruits Canned fruit in a light or heavy syrup. Fried fruit. Fruit in cream or butter sauce. Vegetables Creamed or fried vegetables. Vegetables in a cheese sauce. Regular canned vegetables (not low-sodium or reduced-sodium). Regular canned tomato sauce and paste (not low-sodium or reduced-sodium). Regular tomato and vegetable juice (not low-sodium or reduced-sodium). Angie Fava. Olives. Grains Baked goods made with fat, such as croissants, muffins, or  some breads. Dry pasta or rice meal packs. Meats and other proteins Fatty cuts of meat. Ribs.  Fried meat. Berniece Salines. Bologna, salami, and other precooked or cured meats, such as sausages or meat loaves. Fat from the back of a pig (fatback). Bratwurst. Salted nuts and seeds. Canned beans with added salt. Canned or smoked fish. Whole eggs or egg yolks. Chicken or [place] with skin. Dairy Whole or 2% milk, cream, and half-and-half. Whole or full-fat cream cheese. Whole-fat or sweetened yogurt. Full-fat cheese. Nondairy creamers. Whipped toppings. Processed cheese and cheese spreads. Fats and oils Butter. Stick margarine. Lard. Shortening. Ghee. Bacon fat. Tropical oils, such as coconut, palm kernel, or palm oil. Seasonings and condiments Onion salt, garlic salt, seasoned salt, table salt, and sea salt. Worcestershire sauce. Tartar sauce. Barbecue sauce. Teriyaki sauce. Soy sauce, including reduced-sodium. Steak sauce. Canned and packaged gravies. Fish sauce. Oyster sauce. Cocktail sauce. Store-bought horseradish. Ketchup. Mustard. Meat flavorings and tenderizers. Bouillon cubes. Hot sauces. Pre-made or packaged marinades. Pre-made or packaged taco seasonings. Relishes. Regular salad dressings. Other foods Salted popcorn and pretzels. The items listed above may not be a complete list of foods and beverages you should avoid. Contact a dietitian for more information. Where to find more information  National Heart, Lung, and Blood Institute: https://wilson-eaton.com/  American Heart Association: www.heart.org  Academy of Nutrition and Dietetics: www.eatright.[place]: www.kidney.org Summary  The DASH eating plan is a healthy eating plan that has been shown to reduce high blood pressure (hypertension). It may also reduce your risk for type 2 diabetes, heart disease, and stroke.  When on the DASH eating plan, aim to eat more fresh fruits and vegetables, whole grains, lean proteins, low-fat dairy, and heart-healthy fats.  With the DASH eating plan, you should limit salt (sodium)  intake to 2,300 mg a day. If you have hypertension, you may need to reduce your sodium intake to 1,500 mg a day.  Work with your health care provider or dietitian to adjust your eating plan to your individual calorie needs. This information is not intended to replace advice given to you by your health care provider. Make sure you discuss any questions you have with your health care provider. Document Revised: 07/27/2019 Document Reviewed: 07/27/2019 Elsevier Patient Education  2021 [employer].

## 2021-01-05 ENCOUNTER — Other Ambulatory Visit: Payer: Self-pay

## 2021-01-05 DIAGNOSIS — Z79891 Long term (current) use of opiate analgesic: Secondary | ICD-10-CM

## 2021-01-05 MED ORDER — HYDROCODONE-ACETAMINOPHEN 10-325 MG PO TABS: 1.0000 | ORAL_TABLET | Freq: Four times a day (QID) | ORAL | 0 refills | Status: DC | PRN

## 2021-02-04 ENCOUNTER — Telehealth: Payer: Self-pay

## 2021-02-04 ENCOUNTER — Other Ambulatory Visit: Payer: Self-pay

## 2021-02-04 DIAGNOSIS — Z79891 Long term (current) use of opiate analgesic: Secondary | ICD-10-CM

## 2021-02-04 MED ORDER — HYDROCODONE-ACETAMINOPHEN 10-325 MG PO TABS: 1.0000 | ORAL_TABLET | Freq: Four times a day (QID) | ORAL | 0 refills | Status: DC | PRN

## 2021-02-04 NOTE — Progress Notes (Signed)
    Chronic Care Management Pharmacy Assistant   Name: Marcus Klein  MRN: [id number] DOB: August 20, 1961   Reason for Encounter: Disease State for diabetes  Recent office visits:  12/31/20-Sara  Owens Shark, CPP, Discussed blood sugar management with patient. Patient reports that he would like to lose some weight. Pharmacist counseled on the option of GLP-1. Patient is not ready to consider at this time. Will revisit after updated blood work.     Recent consult visits:  none  Hospital visits:  None in previous 6 months  Medications: Outpatient Encounter Medications as of 02/04/2021  Medication Sig   atorvastatin (LIPITOR) 80 MG tablet TAKE 1 TABLET BY MOUTH  DAILY   clopidogrel (PLAVIX) 75 MG tablet TAKE 1 TABLET BY MOUTH  DAILY   hydrochlorothiazide (HYDRODIURIL) 25 MG tablet TAKE 1 TABLET BY MOUTH  DAILY   HYDROcodone-acetaminophen (NORCO) 10-325 MG tablet Take 1 tablet by mouth every 6 (six) hours as needed.   JANUMET XR [phone] MG TB24 TAKE 1 TABLET BY MOUTH  DAILY   lisinopril (ZESTRIL) 10 MG tablet TAKE 1 TABLET BY MOUTH  DAILY   metoprolol succinate (TOPROL-XL) 50 MG 24 hr tablet TAKE 1 TABLET BY MOUTH  TWICE DAILY   sildenafil (VIAGRA) 50 MG tablet Take 1 tablet (50 mg total) by mouth daily as needed.   No facility-administered encounter medications on file as of 02/04/2021.    Recent Relevant Labs: Lab Results  Component Value Date/Time   HGBA1C 6.9 (H) 10/31/2020 08:00 AM   HGBA1C 6.6 (H) 07/01/2020 08:07 AM   MICROALBUR 80 11/14/2019 08:17 AM    Kidney Function Lab Results  Component Value Date/Time   CREATININE 0.93 10/31/2020 08:00 AM   CREATININE 1.07 07/01/2020 08:07 AM   GFRNONAA 90 10/31/2020 08:00 AM   GFRAA 104 10/31/2020 08:00 AM    Patient has been unable to reach  Current antihyperglycemic regimen:  Janumet XR [phone] mg daily   Adherence Review: Is the patient currently on a STATIN medication? Yes Is the patient currently on ACE/ARB medication?  Yes Does the patient have >5 day gap between last estimated fill dates? CPP to review  Star Rating Drugs:  Medication:  Last Fill: Day Supply Atorvastatin  11/21/20 90 Plavix   11/21/20 90 Lisinopril  11/21/20 90 Metoprolol  11/21/20 90 HCTZ   11/21/20 90  Gaps Colon cancer screening: 09/06/19 Foot Exam: 07/01/20 Eye Exam: 08/05/20 AWV: None listed  Clarita Leber, [place] Pharmacist Assistant [phone]

## 2021-03-02 ENCOUNTER — Encounter: Payer: Self-pay | Admitting: Legal Medicine

## 2021-03-02 ENCOUNTER — Ambulatory Visit (INDEPENDENT_AMBULATORY_CARE_PROVIDER_SITE_OTHER): Payer: Medicare Other | Admitting: Legal Medicine

## 2021-03-02 ENCOUNTER — Other Ambulatory Visit: Payer: Self-pay

## 2021-03-02 VITALS — BP 122/74 | HR 58 | Resp 18 | Ht 70.0 in | Wt 222.4 lb

## 2021-03-02 DIAGNOSIS — E1159 Type 2 diabetes mellitus with other circulatory complications: Secondary | ICD-10-CM | POA: Diagnosis not present

## 2021-03-02 DIAGNOSIS — I69351 Hemiplegia and hemiparesis following cerebral infarction affecting right dominant side: Secondary | ICD-10-CM | POA: Diagnosis not present

## 2021-03-02 DIAGNOSIS — I10 Essential (primary) hypertension: Secondary | ICD-10-CM

## 2021-03-02 DIAGNOSIS — Z6832 Body mass index (BMI) 32.0-32.9, adult: Secondary | ICD-10-CM | POA: Insufficient documentation

## 2021-03-02 DIAGNOSIS — E1169 Type 2 diabetes mellitus with other specified complication: Secondary | ICD-10-CM | POA: Diagnosis not present

## 2021-03-02 DIAGNOSIS — E782 Mixed hyperlipidemia: Secondary | ICD-10-CM

## 2021-03-02 DIAGNOSIS — Z6831 Body mass index (BMI) 31.0-31.9, adult: Secondary | ICD-10-CM

## 2021-03-02 DIAGNOSIS — I152 Hypertension secondary to endocrine disorders: Secondary | ICD-10-CM | POA: Diagnosis not present

## 2021-03-02 DIAGNOSIS — E669 Obesity, unspecified: Secondary | ICD-10-CM

## 2021-03-02 MED ORDER — OZEMPIC (0.25 OR 0.5 MG/DOSE) 2 MG/1.5ML ~~LOC~~ SOPN: 0.5000 mg | PEN_INJECTOR | SUBCUTANEOUS | 3 refills | Status: DC
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
## 2021-03-03 LAB — COMPREHENSIVE METABOLIC PANEL
ALT: 26 IU/L (ref 0–44)
AST: 18 IU/L (ref 0–40)
Albumin/Globulin Ratio: 1.9 (ref 1.2–2.2)
Albumin: 4.6 g/dL (ref 3.8–4.9)
Alkaline Phosphatase: 123 IU/L — ABNORMAL HIGH (ref 44–121)
BUN/Creatinine Ratio: 26 — ABNORMAL HIGH (ref 9–20)
BUN: 25 mg/dL — ABNORMAL HIGH (ref 6–24)
Bilirubin Total: 0.4 mg/dL (ref 0.0–1.2)
CO2: 25 mmol/L (ref 20–29)
Calcium: 9.6 mg/dL (ref 8.7–10.2)
Chloride: 100 mmol/L (ref 96–106)
Creatinine, Ser: 0.98 mg/dL (ref 0.76–1.27)
Globulin, Total: 2.4 g/dL (ref 1.5–4.5)
Glucose: 136 mg/dL — ABNORMAL HIGH (ref 65–99)
Potassium: 4.1 mmol/L (ref 3.5–5.2)
Sodium: 141 mmol/L (ref 134–144)
Total Protein: 7 g/dL (ref 6.0–8.5)
eGFR: 89 mL/min/{1.73_m2} (ref >59)

## 2021-03-03 LAB — CBC WITH DIFFERENTIAL/PLATELET
Basophils Absolute: 0.1 10*3/uL (ref 0.0–0.2)
Basos: 1 %
EOS (ABSOLUTE): 0.1 10*3/uL (ref 0.0–0.4)
Eos: 2 %
Hematocrit: 45.5 % (ref 37.5–51.0)
Hemoglobin: 14.8 g/dL (ref 13.0–17.7)
Immature Grans (Abs): 0 10*3/uL (ref 0.0–0.1)
Immature Granulocytes: 0 %
Lymphocytes Absolute: 2 10*3/uL (ref 0.7–3.1)
Lymphs: 31 %
MCH: 29.2 pg (ref 26.6–33.0)
MCHC: 32.5 g/dL (ref 31.5–35.7)
MCV: 90 fL (ref 79–97)
Monocytes Absolute: 0.7 10*3/uL (ref 0.1–0.9)
Monocytes: 11 %
Neutrophils Absolute: 3.5 10*3/uL (ref 1.4–7.0)
Neutrophils: 55 %
Platelets: 249 10*3/uL (ref 150–450)
RBC: 5.07 x10E6/uL (ref 4.14–5.80)
RDW: 13.7 % (ref 11.6–15.4)
WBC: 6.5 10*3/uL (ref 3.4–10.8)

## 2021-03-03 LAB — LIPID PANEL
Chol/HDL Ratio: 4.7 ratio (ref 0.0–5.0)
Cholesterol, Total: 170 mg/dL (ref 100–199)
HDL: 36 mg/dL — ABNORMAL LOW (ref >39)
LDL Chol Calc (NIH): 102 mg/dL — ABNORMAL HIGH (ref 0–99)
Triglycerides: 186 mg/dL — ABNORMAL HIGH (ref 0–149)
VLDL Cholesterol Cal: 32 mg/dL (ref 5–40)

## 2021-03-03 LAB — HEMOGLOBIN A1C
Est. average glucose Bld gHb Est-mCnc: 148 mg/dL
Hgb A1c MFr Bld: 6.8 % — ABNORMAL HIGH (ref 4.8–5.6)

## 2021-03-03 LAB — CARDIOVASCULAR RISK ASSESSMENT

## 2021-03-03 NOTE — Progress Notes (Signed)
CBC normal, glucose 136, kidney tests normal, Liver tests normal, A1c 6.8 good, triglycerides high, LDLc 102 watch diet lp

## 2021-03-06 ENCOUNTER — Other Ambulatory Visit: Payer: Self-pay

## 2021-03-06 DIAGNOSIS — Z79891 Long term (current) use of opiate analgesic: Secondary | ICD-10-CM

## 2021-03-06 MED ORDER — HYDROCODONE-ACETAMINOPHEN 10-325 MG PO TABS: 1.0000 | ORAL_TABLET | Freq: Four times a day (QID) | ORAL | 0 refills | Status: DC | PRN

## 2021-03-18 ENCOUNTER — Telehealth: Payer: Self-pay

## 2021-03-18 NOTE — Progress Notes (Signed)
Chronic Care Management Pharmacy Assistant   Name: Marcus Klein  MRN: [id number] DOB: 04/15/1961  Reason for Encounter: Disease State for diabetes  Recent office visits:  03/02/21-hyperlipidemia, labs ordered, start Ozempic,  Lab results: CBC normal, glucose 136, kidney tests normal, Liver tests normal, A1c 6.8 good, triglycerides high, LDLc 102 watch diet lp. Follow 30mos.   Recent consult visits:  None  Hospital visits:  None in previous 6 months  Medications: Outpatient Encounter Medications as of 03/18/2021  Medication Sig   atorvastatin (LIPITOR) 80 MG tablet TAKE 1 TABLET BY MOUTH  DAILY   clopidogrel (PLAVIX) 75 MG tablet TAKE 1 TABLET BY MOUTH  DAILY   hydrochlorothiazide (HYDRODIURIL) 25 MG tablet TAKE 1 TABLET BY MOUTH  DAILY   HYDROcodone-acetaminophen (NORCO) 10-325 MG tablet Take 1 tablet by mouth every 6 (six) hours as needed.   JANUMET XR [phone] MG TB24 TAKE 1 TABLET BY MOUTH  DAILY   lisinopril (ZESTRIL) 10 MG tablet TAKE 1 TABLET BY MOUTH  DAILY   metoprolol succinate (TOPROL-XL) 50 MG 24 hr tablet TAKE 1 TABLET BY MOUTH  TWICE DAILY   Semaglutide,0.25 or 0.5MG /DOS, (OZEMPIC, 0.25 OR 0.5 MG/DOSE,) 2 MG/1.5ML SOPN Inject 0.5 mg into the skin once a week.   sildenafil (VIAGRA) 50 MG tablet Take 1 tablet (50 mg total) by mouth daily as needed.   No facility-administered encounter medications on file as of 03/18/2021.   Recent Relevant Labs: Lab Results  Component Value Date/Time   HGBA1C 6.8 (H) 03/02/2021 07:54 AM   HGBA1C 6.9 (H) 10/31/2020 08:00 AM   MICROALBUR 80 11/14/2019 08:17 AM    Kidney Function Lab Results  Component Value Date/Time   CREATININE 0.98 03/02/2021 07:54 AM   CREATININE 0.93 10/31/2020 08:00 AM   GFRNONAA 90 10/31/2020 08:00 AM   GFRAA 104 10/31/2020 08:00 AM     Current antihyperglycemic regimen:  Janumet XR [phone] mg daily   Patient verbally confirms he is taking the above medications as directed. Yes  What recent  interventions/DTPs have been made to improve glycemic control:  Patient stated he is doing well, he does not sleep well at night so he usually takes a nap around 1pm or so.  He denies any medication issues at this time   Have there been any recent hospitalizations or ED visits since last visit with CPP? No  Patient denies hypoglycemic symptoms, including None  Patient denies hyperglycemic symptoms, including none  How often are you checking your blood sugar? once daily  What are your blood sugars ranging? Patient stated his blood sugars stay around 130.   During the week, how often does your blood glucose drop below 70? Never  Are you checking your feet daily/regularly? Yes  Adherence Review: Is the patient currently on a STATIN medication? Yes Is the patient currently on ACE/ARB medication? Yes Does the patient have >5 day gap between last estimated fill dates? CPP to review  Care Gaps: Last eye exam / Retinopathy Screening? 08/05/20 Last Annual Wellness Visit?  None Listed Last Diabetic Foot Exam?  03/02/21   Star Rating Drugs:  Medication:  Last Fill: Day Supply Atorvastatin       02/09/21  90            Plavix        02/09/21  90                      Lisinopril  02/09/21  90 Metoprolol      02/09/21  90                HCTZ        02/09/21  [place], [place] Pharmacist Assistant [phone]

## 2021-03-19 ENCOUNTER — Telehealth: Payer: Medicare Other

## 2021-04-06 ENCOUNTER — Other Ambulatory Visit: Payer: Self-pay

## 2021-04-06 DIAGNOSIS — Z79891 Long term (current) use of opiate analgesic: Secondary | ICD-10-CM

## 2021-04-06 MED ORDER — HYDROCODONE-ACETAMINOPHEN 10-325 MG PO TABS: 1.0000 | ORAL_TABLET | Freq: Four times a day (QID) | ORAL | 0 refills | Status: DC | PRN

## 2021-04-12 ENCOUNTER — Other Ambulatory Visit: Payer: Self-pay | Admitting: Legal Medicine

## 2021-04-12 DIAGNOSIS — I10 Essential (primary) hypertension: Secondary | ICD-10-CM

## 2021-04-29 LAB — HM HEPATITIS C SCREENING LAB: HM Hepatitis Screen: NEGATIVE

## 2021-05-03 ENCOUNTER — Telehealth: Payer: Self-pay

## 2021-05-03 NOTE — Telephone Encounter (Signed)
Called pt to schedule an Telephone AWV. Pt VM was not set up. Could not leave VM.  KP

## 2021-05-06 ENCOUNTER — Encounter: Payer: Self-pay | Admitting: Legal Medicine

## 2021-05-06 ENCOUNTER — Ambulatory Visit (INDEPENDENT_AMBULATORY_CARE_PROVIDER_SITE_OTHER): Payer: Medicare Other | Admitting: Legal Medicine

## 2021-05-06 ENCOUNTER — Other Ambulatory Visit: Payer: Self-pay

## 2021-05-06 VITALS — BP 120/70 | HR 57 | Temp 98.2°F | Resp 16 | Ht 70.0 in | Wt 223.0 lb

## 2021-05-06 DIAGNOSIS — I69351 Hemiplegia and hemiparesis following cerebral infarction affecting right dominant side: Secondary | ICD-10-CM | POA: Diagnosis not present
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
## 2021-05-07 ENCOUNTER — Other Ambulatory Visit: Payer: Self-pay

## 2021-05-07 DIAGNOSIS — Z79891 Long term (current) use of opiate analgesic: Secondary | ICD-10-CM

## 2021-05-07 LAB — FECAL OCCULT BLOOD, GUAIAC: Fecal Occult Blood: NEGATIVE

## 2021-05-07 MED ORDER — HYDROCODONE-ACETAMINOPHEN 10-325 MG PO TABS: 1.0000 | ORAL_TABLET | Freq: Four times a day (QID) | ORAL | 0 refills | Status: DC | PRN

## 2021-05-12 ENCOUNTER — Encounter: Payer: Self-pay | Admitting: Legal Medicine

## 2021-05-13 ENCOUNTER — Telehealth: Payer: Self-pay

## 2021-05-13 NOTE — Chronic Care Management (AMB) (Signed)
    Chronic Care Management Pharmacy Assistant   Name: LOYS BLASS  MRN: [id number] DOB: December 08, 1960  Reason for Encounter: General Adherence Call   Recent office visits:  05/06/21- Reinaldo Meeker, MD- seen for hemaparesis affetcting right side as late effect of CVA, discussed robotic prothesis, follow up 1 month for check on robotic progress  Recent consult visits:  No visits noted  Hospital visits:  None in previous 6 months  Medications: Outpatient Encounter Medications as of 05/13/2021  Medication Sig   atorvastatin (LIPITOR) 80 MG tablet TAKE 1 TABLET BY MOUTH  DAILY   clopidogrel (PLAVIX) 75 MG tablet TAKE 1 TABLET BY MOUTH  DAILY   hydrochlorothiazide (HYDRODIURIL) 25 MG tablet TAKE 1 TABLET BY MOUTH  DAILY   HYDROcodone-acetaminophen (NORCO) 10-325 MG tablet Take 1 tablet by mouth every 6 (six) hours as needed.   JANUMET XR [phone] MG TB24 TAKE 1 TABLET BY MOUTH  DAILY   lisinopril (ZESTRIL) 10 MG tablet TAKE 1 TABLET BY MOUTH  DAILY   metoprolol succinate (TOPROL-XL) 50 MG 24 hr tablet TAKE 1 TABLET BY MOUTH  TWICE DAILY   Semaglutide,0.25 or 0.'5MG'$ /DOS, (OZEMPIC, 0.25 OR 0.5 MG/DOSE,) 2 MG/1.5ML SOPN Inject 0.5 mg into the skin once a week.   sildenafil (VIAGRA) 50 MG tablet Take 1 tablet (50 mg total) by mouth daily as needed.   No facility-administered encounter medications on file as of 05/13/2021.    [place] for general disease state and medication adherence call.   Patient is not > 5 days past due for refill on the following medications per chart history:  [facility] Medications: Medication Name/mg Last Fill Days Supply Lisinopril 10 mg  04/16/21 90 DS Ozempic   03/04/21 28 DS Patient stated he has never filled this due to medication cost  Atorvastatin 80 mg  04/16/21 90 DS   What concerns do you have about your medications? Patient stated he has no concerns about his medication   The patient denies side effects with her medications.   How  often do you forget or accidentally miss a dose? Never  Do you use a pillbox? No  Are you having any problems getting your medications from your pharmacy? No  Has the cost of your medications been a concern? Yes Patient stated that Ozempic is unaffordable so he has not began taking this. Patient stated that his Janumet is also expensive in the donut hole. Patient assistance applications were prepared and reviewed for both medications. Mr. Jissie is aware that these will be mailed to him for completion   Since last visit with CPP, no interventions have been made:   The patient has not had an ED visit since last contact.   The patient denies problems with their health.   she denies  concerns or questions for Sherre Poot, at this time.   Patient states BP and BG readings are as follows  Fasting: 130,150  Before meals:  After meals:  Bedtime:   Patient stated he checks his BP regularly but could not provide any readings  Counseled patient on:   [place] job taking medications!   Importance of taking medication daily without missed doses  Benefits of adherence packaging or a pillbox   Access to CCM team for any cost, medication, or pharmacy concerns  Care Gaps: Last annual wellness visit? Message sent to CMA to schedule If applicable: Last eye exam / retinopathy screening? 08/05/20 Diabetic foot exam? 10/31/20  [facility] CPA, CMA

## 2021-05-22 ENCOUNTER — Other Ambulatory Visit: Payer: Self-pay | Admitting: Legal Medicine

## 2021-05-22 DIAGNOSIS — N5201 Erectile dysfunction due to arterial insufficiency: Secondary | ICD-10-CM

## 2021-05-23 ENCOUNTER — Telehealth: Payer: Self-pay

## 2021-05-23 NOTE — Telephone Encounter (Signed)
Patient refused to do AWV.

## 2021-05-25 ENCOUNTER — Other Ambulatory Visit: Payer: Self-pay

## 2021-05-25 DIAGNOSIS — N5201 Erectile dysfunction due to arterial insufficiency: Secondary | ICD-10-CM

## 2021-05-25 MED ORDER — SILDENAFIL CITRATE 50 MG PO TABS: 50.0000 mg | ORAL_TABLET | Freq: Every day | ORAL | 6 refills | Status: DC | PRN

## 2021-05-26 ENCOUNTER — Other Ambulatory Visit: Payer: Self-pay

## 2021-05-26 DIAGNOSIS — E669 Obesity, unspecified: Secondary | ICD-10-CM

## 2021-05-26 DIAGNOSIS — E1159 Type 2 diabetes mellitus with other circulatory complications: Secondary | ICD-10-CM

## 2021-05-26 MED ORDER — OZEMPIC (0.25 OR 0.5 MG/DOSE) 2 MG/1.5ML ~~LOC~~ SOPN: 0.5000 mg | PEN_INJECTOR | SUBCUTANEOUS | 3 refills | Status: DC

## 2021-05-26 MED ORDER — JANUMET XR 100-1000 MG PO TB24: 1.0000 | ORAL_TABLET | Freq: Every day | ORAL | 3 refills | Status: DC

## 2021-06-11 ENCOUNTER — Other Ambulatory Visit: Payer: Self-pay

## 2021-06-11 DIAGNOSIS — Z79891 Long term (current) use of opiate analgesic: Secondary | ICD-10-CM

## 2021-06-11 MED ORDER — HYDROCODONE-ACETAMINOPHEN 10-325 MG PO TABS: 1.0000 | ORAL_TABLET | Freq: Four times a day (QID) | ORAL | 0 refills | Status: DC | PRN

## 2021-07-01 ENCOUNTER — Other Ambulatory Visit: Payer: Self-pay

## 2021-07-01 ENCOUNTER — Ambulatory Visit (INDEPENDENT_AMBULATORY_CARE_PROVIDER_SITE_OTHER): Payer: Medicare Other | Admitting: Legal Medicine

## 2021-07-01 ENCOUNTER — Encounter: Payer: Self-pay | Admitting: Legal Medicine

## 2021-07-01 VITALS — BP 140/80 | HR 66 | Temp 96.7°F | Resp 15 | Ht 70.0 in | Wt 224.0 lb

## 2021-07-01 DIAGNOSIS — I69351 Hemiplegia and hemiparesis following cerebral infarction affecting right dominant side: Secondary | ICD-10-CM | POA: Diagnosis not present
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
## 2021-07-06 ENCOUNTER — Encounter: Payer: Self-pay | Admitting: Legal Medicine

## 2021-07-06 ENCOUNTER — Other Ambulatory Visit: Payer: Self-pay

## 2021-07-06 ENCOUNTER — Ambulatory Visit (INDEPENDENT_AMBULATORY_CARE_PROVIDER_SITE_OTHER): Payer: Medicare Other | Admitting: Legal Medicine

## 2021-07-06 VITALS — BP 144/74 | HR 63 | Temp 97.2°F | Resp 18 | Ht 70.0 in | Wt 224.2 lb

## 2021-07-06 DIAGNOSIS — E1159 Type 2 diabetes mellitus with other circulatory complications: Secondary | ICD-10-CM | POA: Diagnosis not present

## 2021-07-06 DIAGNOSIS — I152 Hypertension secondary to endocrine disorders: Secondary | ICD-10-CM

## 2021-07-06 DIAGNOSIS — E782 Mixed hyperlipidemia: Secondary | ICD-10-CM

## 2021-07-06 DIAGNOSIS — E1169 Type 2 diabetes mellitus with other specified complication: Secondary | ICD-10-CM | POA: Diagnosis not present

## 2021-07-06 DIAGNOSIS — I69351 Hemiplegia and hemiparesis following cerebral infarction affecting right dominant side: Secondary | ICD-10-CM | POA: Diagnosis not present

## 2021-07-06 DIAGNOSIS — Z6832 Body mass index (BMI) 32.0-32.9, adult: Secondary | ICD-10-CM

## 2021-07-06 DIAGNOSIS — Z79891 Long term (current) use of opiate analgesic: Secondary | ICD-10-CM | POA: Diagnosis not present

## 2021-07-06 DIAGNOSIS — I10 Essential (primary) hypertension: Secondary | ICD-10-CM | POA: Diagnosis not present

## 2021-07-06 DIAGNOSIS — E669 Obesity, unspecified: Secondary | ICD-10-CM
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
## 2021-07-07 DIAGNOSIS — I69351 Hemiplegia and hemiparesis following cerebral infarction affecting right dominant side: Secondary | ICD-10-CM | POA: Diagnosis not present

## 2021-07-07 LAB — COMPREHENSIVE METABOLIC PANEL
ALT: 27 IU/L (ref 0–44)
AST: 20 IU/L (ref 0–40)
Albumin/Globulin Ratio: 2 (ref 1.2–2.2)
Albumin: 4.8 g/dL (ref 3.8–4.9)
Alkaline Phosphatase: 130 IU/L — ABNORMAL HIGH (ref 44–121)
BUN/Creatinine Ratio: 20 (ref 10–24)
BUN: 22 mg/dL (ref 8–27)
Bilirubin Total: 0.5 mg/dL (ref 0.0–1.2)
CO2: 24 mmol/L (ref 20–29)
Calcium: 9.8 mg/dL (ref 8.6–10.2)
Chloride: 100 mmol/L (ref 96–106)
Creatinine, Ser: 1.09 mg/dL (ref 0.76–1.27)
Globulin, Total: 2.4 g/dL (ref 1.5–4.5)
Glucose: 144 mg/dL — ABNORMAL HIGH (ref 70–99)
Potassium: 4.4 mmol/L (ref 3.5–5.2)
Sodium: 138 mmol/L (ref 134–144)
Total Protein: 7.2 g/dL (ref 6.0–8.5)
eGFR: 78 mL/min/{1.73_m2} (ref >59)

## 2021-07-07 LAB — CBC WITH DIFFERENTIAL/PLATELET
Basophils Absolute: 0.1 10*3/uL (ref 0.0–0.2)
Basos: 1 %
EOS (ABSOLUTE): 0.2 10*3/uL (ref 0.0–0.4)
Eos: 2 %
Hematocrit: 47.6 % (ref 37.5–51.0)
Hemoglobin: 15.7 g/dL (ref 13.0–17.7)
Immature Grans (Abs): 0 10*3/uL (ref 0.0–0.1)
Immature Granulocytes: 0 %
Lymphocytes Absolute: 2.5 10*3/uL (ref 0.7–3.1)
Lymphs: 31 %
MCH: 29.5 pg (ref 26.6–33.0)
MCHC: 33 g/dL (ref 31.5–35.7)
MCV: 90 fL (ref 79–97)
Monocytes Absolute: 0.8 10*3/uL (ref 0.1–0.9)
Monocytes: 10 %
Neutrophils Absolute: 4.6 10*3/uL (ref 1.4–7.0)
Neutrophils: 56 %
Platelets: 272 10*3/uL (ref 150–450)
RBC: 5.32 x10E6/uL (ref 4.14–5.80)
RDW: 13.1 % (ref 11.6–15.4)
WBC: 8.1 10*3/uL (ref 3.4–10.8)

## 2021-07-07 LAB — LIPID PANEL
Chol/HDL Ratio: 4.8 ratio (ref 0.0–5.0)
Cholesterol, Total: 184 mg/dL (ref 100–199)
HDL: 38 mg/dL — ABNORMAL LOW (ref >39)
LDL Chol Calc (NIH): 104 mg/dL — ABNORMAL HIGH (ref 0–99)
Triglycerides: 243 mg/dL — ABNORMAL HIGH (ref 0–149)
VLDL Cholesterol Cal: 42 mg/dL — ABNORMAL HIGH (ref 5–40)

## 2021-07-07 LAB — HEMOGLOBIN A1C
Est. average glucose Bld gHb Est-mCnc: 148 mg/dL
Hgb A1c MFr Bld: 6.8 % — ABNORMAL HIGH (ref 4.8–5.6)

## 2021-07-07 LAB — CARDIOVASCULAR RISK ASSESSMENT

## 2021-07-07 NOTE — Progress Notes (Signed)
Cbc normal, glucose 144, kidney tests normal, liver tests normal, A1c 6.8, triglycerides high 243, ldl cholesterol 104,  lp

## 2021-07-09 ENCOUNTER — Other Ambulatory Visit: Payer: Self-pay

## 2021-07-09 DIAGNOSIS — Z79891 Long term (current) use of opiate analgesic: Secondary | ICD-10-CM

## 2021-07-09 MED ORDER — HYDROCODONE-ACETAMINOPHEN 10-325 MG PO TABS: 1.0000 | ORAL_TABLET | Freq: Four times a day (QID) | ORAL | 0 refills | Status: DC | PRN

## 2021-07-10 ENCOUNTER — Telehealth: Payer: Self-pay

## 2021-07-10 NOTE — Telephone Encounter (Signed)
Physical therapy from [facility] let us known that patient would like to have home health  PT. They told to patient that [facility] did not do fitting prosthesis. Patient refused to go to Hospital for PT.

## 2021-07-12 NOTE — Telephone Encounter (Signed)
Then we are at an impass. lp

## 2021-07-17 DIAGNOSIS — I69351 Hemiplegia and hemiparesis following cerebral infarction affecting right dominant side: Secondary | ICD-10-CM | POA: Diagnosis not present

## 2021-07-21 ENCOUNTER — Telehealth: Payer: Self-pay

## 2021-07-21 NOTE — Chronic Care Management (AMB) (Signed)
Pt confirmed telephone appt with CPP tomorrow at 3:30 pm   [name], [place] Pharmacist Assistant  [phone]

## 2021-07-22 ENCOUNTER — Ambulatory Visit (INDEPENDENT_AMBULATORY_CARE_PROVIDER_SITE_OTHER): Payer: Medicare Other

## 2021-07-22 ENCOUNTER — Encounter: Payer: Self-pay | Admitting: Legal Medicine

## 2021-07-22 ENCOUNTER — Telehealth: Payer: Self-pay

## 2021-07-22 ENCOUNTER — Other Ambulatory Visit: Payer: Self-pay

## 2021-07-22 DIAGNOSIS — E782 Mixed hyperlipidemia: Secondary | ICD-10-CM

## 2021-07-22 DIAGNOSIS — I10 Essential (primary) hypertension: Secondary | ICD-10-CM

## 2021-07-22 DIAGNOSIS — E1159 Type 2 diabetes mellitus with other circulatory complications: Secondary | ICD-10-CM

## 2021-07-22 DIAGNOSIS — E669 Obesity, unspecified: Secondary | ICD-10-CM

## 2021-07-22 NOTE — Telephone Encounter (Signed)
-----   Message from Lane Hacker, [facility] sent at 07/22/2021  3:32 PM EST ----- Can we get an AWV? Also, his last BP of the year is above 735 systolic so the office is going to get dinged. We need a BP below 329 systolic (Which we can do at the AWV) so it doesn't hurt the numbers. Thank you!

## 2021-07-22 NOTE — Patient Instructions (Signed)
Visit Information   Goals Addressed   None    Patient Care Plan: CCM Pharmacy Care Plan     Problem Identified: htn, hld, dm   Priority: High  Onset Date: 01/02/2021     Long-Range Goal: Disease State Management   Start Date: 01/02/2021  Expected End Date: 01/02/2022  Recent Progress: On track  Priority: High  Note:     Current Barriers:  Unable to achieve control of cholesterol   Pharmacist Clinical Goal(s):  Patient will achieve control of cholseterol as evidenced by lipid panel through collaboration with PharmD and provider.   Interventions: 1:1 collaboration with Lillard Anes, MD regarding development and update of comprehensive plan of care as evidenced by provider attestation and co-signature Inter-disciplinary care team collaboration (see longitudinal plan of care) Comprehensive medication review performed; medication list updated in electronic medical record  Hypertension (BP goal <130/80) BP Readings from Last 3 Encounters:  07/06/21 (!) 144/74  07/01/21 140/80  05/06/21 120/70  -Controlled -Current treatment: hydrochlorothiazide 25 mg daily Lisinopril 10 mg daily Metoprolol succinate 50 mg bid -Medications previously tried: none reported  -Current home readings: not checking  -Current dietary habits: fried seafood, eats at home during the week -Current exercise habits: limited to yard work -Denies hypotensive/hypertensive symptoms -Educated on BP goals and benefits of medications for prevention of heart attack, stroke and kidney damage; Daily salt intake goal < 2300 mg; -Counseled to monitor BP at home as needed, document, and provide log at future appointments -Counseled on diet and exercise extensively Recommended to continue current medication  CAD:(LDL goal < 70) -Current treatment: atorvastatin 80 mg daily  Clopidogrel 75 mg daily  -Medications previously tried: none reported  -Current dietary patterns: no true "diet". Eats at home  during the week mostly and enjoys fried seafood out on weekends.  -Current exercise habits: limited -Educated on Cholesterol goals;  Benefits of statin for ASCVD risk reduction; Importance of limiting foods high in cholesterol; -Counseled on diet and exercise extensively Recommended to continue current medication  Diabetes (A1c goal <7%) -Controlled -Current medications: Janumet XR [phone] mg daily (Not taking due to cost) Ozempic (Not taking due to cost) -Medications previously tried: none reported -Current home glucose readings fasting glucose: 129 mg/dL post prandial glucose: not reported -Denies hypoglycemic/hyperglycemic symptoms -Current meal patterns:  breakfast: doesn't each much breakfast lunch: eats at home normally  dinner: seafood snacks: none reported drinks: diet green tea, coffee -Current exercise: limited  -Educated on A1c and blood sugar goals; Complications of diabetes including kidney damage, retinal damage, and cardiovascular disease; Benefits of weight loss; Benefits of routine self-monitoring of blood sugar; Carbohydrate counting and/or plate method -Counseled to check feet daily and get yearly eye exams -Counseled on diet and exercise extensively November 2022: Patient states he did a form for a med but he doesn't know what for. Will get PAP's for Ozempic and Janumet  Pain (Goal: manage symptoms of pain ) -Controlled -Current treatment  hydrocodone-acetaminophen 10-325 mg every 6 hours prn  -Medications previously tried: tramadol  -Recommended to continue current medication  Health Maintenance -Vaccine gaps: assess at future visits -Current therapy:  sildenafil 50 mg daily prn  -Patient is satisfied with current therapy and denies issues -Recommended to continue current medication   Patient Goals/Self-Care Activities Patient will:  - take medications as prescribed focus on medication adherence by using pill box check glucose daily,  document, and provide at future appointments engage in dietary modifications by limiting carbohydrate intake.   Follow Up  Plan: Telephone follow up appointment with care management team member scheduled for: 03/2022  [place] Constable, Pharm.D. - [phone]       The patient verbalized understanding of instructions, educational materials, and care plan provided today and declined offer to receive copy of patient instructions, educational materials, and care plan.  The pharmacy team will reach out to the patient again over the next 90 days.   Lane Hacker, [facility]

## 2021-07-22 NOTE — Progress Notes (Signed)
Chronic Care Management Pharmacy Note  07/22/2021 Name:  Marcus Klein MRN:  [id number] DOB:  03/15/1961   Plan Updates:  Will start PAP's for Janumet and Ozempic since he is no longer taking them due to cost   Subjective: Marcus Klein is an 60 y.o. year old male who is a primary patient of Henrene Pastor, Zeb Comfort, MD.  The CCM team was consulted for assistance with disease management and care coordination needs.    Engaged with patient by telephone for follow up visit in response to provider referral for pharmacy case management and/or care coordination services.   Consent to Services:  The patient was given the following information about Chronic Care Management services today, agreed to services, and gave verbal consent: 1. CCM service includes personalized support from designated clinical staff supervised by the primary care provider, including individualized plan of care and coordination with other care providers 2. 24/7 contact phone numbers for assistance for urgent and routine care needs. 3. Service will only be billed when office clinical staff spend 20 minutes or more in a month to coordinate care. 4. Only one practitioner may furnish and bill the service in a calendar month. 5.The patient may stop CCM services at any time (effective at the end of the month) by phone call to the office staff. 6. The patient will be responsible for cost sharing (co-pay) of up to 20% of the service fee (after annual deductible is met). Patient agreed to services and consent obtained.  Patient Care Team: Lillard Anes, MD as PCP - General (Family Medicine) Burnice Logan, [facility] (Inactive) as Pharmacist (Pharmacist)  Recent office visits:  05/06/21- Reinaldo Meeker, MD- seen for hemaparesis affetcting right side as late effect of CVA, discussed robotic prothesis, follow up 1 month for check on robotic progress   Recent consult visits:  No visits noted   Hospital visits:  None in  previous 6 months  Objective:  Lab Results  Component Value Date   CREATININE 1.09 07/06/2021   BUN 22 07/06/2021   GFRNONAA 90 10/31/2020   GFRAA 104 10/31/2020   NA 138 07/06/2021   K 4.4 07/06/2021   CALCIUM 9.8 07/06/2021   CO2 24 07/06/2021   GLUCOSE 144 (H) 07/06/2021    Lab Results  Component Value Date/Time   HGBA1C 6.8 (H) 07/06/2021 08:20 AM   HGBA1C 6.8 (H) 03/02/2021 07:54 AM   MICROALBUR 80 11/14/2019 08:17 AM    Last diabetic Eye exam: No results found for: HMDIABEYEEXA  Last diabetic Foot exam: No results found for: HMDIABFOOTEX   Lab Results  Component Value Date   CHOL 184 07/06/2021   HDL 38 (L) 07/06/2021   LDLCALC 104 (H) 07/06/2021   TRIG 243 (H) 07/06/2021   CHOLHDL 4.8 07/06/2021    Hepatic Function Latest Ref Rng & Units 07/06/2021 03/02/2021 10/31/2020  Total Protein 6.0 - 8.5 g/dL 7.2 7.0 7.4  Albumin 3.8 - 4.9 g/dL 4.8 4.6 4.5  AST 0 - 40 IU/L 20 18 21   ALT 0 - 44 IU/L 27 26 30   Alk Phosphatase 44 - 121 IU/L 130(H) 123(H) 125(H)  Total Bilirubin 0.0 - 1.2 mg/dL 0.5 0.4 0.6    No results found for: TSH, FREET4  CBC Latest Ref Rng & Units 07/06/2021 03/02/2021 10/31/2020  WBC 3.4 - 10.8 x10E3/uL 8.1 6.5 7.0  Hemoglobin 13.0 - 17.7 g/dL 15.7 14.8 16.0  Hematocrit 37.5 - 51.0 % 47.6 45.5 47.7  Platelets 150 - 450 x10E3/uL 272  249 266    No results found for: VD25OH  Clinical ASCVD: No  The 10-year ASCVD risk score (Arnett DK, et al., 2019) is: 24.9%   Values used to calculate the score:     Age: 20 years     Sex: Male     Is Non-Hispanic African American: No     Diabetic: Yes     Tobacco smoker: No     Systolic Blood Pressure: 096 mmHg     Is BP treated: Yes     HDL Cholesterol: 38 mg/dL     Total Cholesterol: 184 mg/dL    Depression screen [facility] 2/9 03/02/2021  Decreased Interest 0  Down, Depressed, Hopeless 0  PHQ - 2 Score 0      Social History   Tobacco Use  Smoking Status Never  Smokeless Tobacco Never   BP Readings  from Last 3 Encounters:  07/06/21 (!) 144/74  07/01/21 140/80  05/06/21 120/70   Pulse Readings from Last 3 Encounters:  07/06/21 63  07/01/21 66  05/06/21 (!) 57   Wt Readings from Last 3 Encounters:  07/06/21 224 lb 3.2 oz (101.7 kg)  07/01/21 224 lb (101.6 kg)  05/06/21 223 lb (101.2 kg)   BMI Readings from Last 3 Encounters:  07/06/21 32.17 kg/m  07/01/21 32.14 kg/m  05/06/21 32.00 kg/m    Assessment/Interventions: Review of patient past medical history, allergies, medications, health status, including review of consultants reports, laboratory and other test data, was performed as part of comprehensive evaluation and provision of chronic care management services.   SDOH:  (Social Determinants of Health) assessments and interventions performed: Yes SDOH Interventions    Flowsheet Row Most Recent Value  SDOH Interventions   Financial Strain Interventions Other (Comment)      SDOH Screenings   Alcohol Screen: Not on file  Depression (PHQ2-9): Low Risk    PHQ-2 Score: 0  Financial Resource Strain: High Risk   Difficulty of Paying Living Expenses: Hard  Food Insecurity: No Food Insecurity   Worried About Charity fundraiser in the Last Year: Never true   Ran Out of Food in the Last Year: Never true  Housing: Low Risk    Last Housing Risk Score: 0  Physical Activity: Not on file  Social Connections: Not on file  Stress: Not on file  Tobacco Use: Low Risk    Smoking Tobacco Use: Never   Smokeless Tobacco Use: Never   Passive Exposure: Not on file  Transportation Needs: No Transportation Needs   Lack of Transportation (Medical): No   Lack of Transportation (Non-Medical): No    CCM Care Plan  Allergies  Allergen Reactions   Tramadol Other (See Comments)    Medications Reviewed Today     Reviewed by Lane Hacker, [facility] (Pharmacist) on 07/22/21 at 1530  Med List Status: <None>   Medication Order Taking? Sig Documenting Provider Last Dose Status  Informant  atorvastatin (LIPITOR) 80 MG tablet [id number] Yes TAKE 1 TABLET BY MOUTH  DAILY Lillard Anes, MD Taking Active   clopidogrel (PLAVIX) 75 MG tablet [id number] Yes TAKE 1 TABLET BY MOUTH  DAILY Lillard Anes, MD Taking Active   hydrochlorothiazide (HYDRODIURIL) 25 MG tablet [id number] Yes TAKE 1 TABLET BY MOUTH  DAILY Lillard Anes, MD Taking Active   HYDROcodone-acetaminophen [facility]) 10-325 MG tablet [id number]  Take 1 tablet by mouth every 6 (six) hours as needed. Lillard Anes, MD  Active   lisinopril (ZESTRIL) 10 MG tablet  [id number] Yes TAKE 1 TABLET BY MOUTH  DAILY Lillard Anes, MD Taking Active   metoprolol succinate (TOPROL-XL) 50 MG 24 hr tablet [id number] Yes TAKE 1 TABLET BY MOUTH  TWICE DAILY Lillard Anes, MD Taking Active   Semaglutide,0.25 or 0.5MG/DOS, (OZEMPIC, 0.25 OR 0.5 MG/DOSE,) 2 MG/1.5ML SOPN [id number] No Inject 0.5 mg into the skin once a week.  Patient not taking: Reported on 07/22/2021   Lillard Anes, MD Not Taking Active   sildenafil (VIAGRA) 50 MG tablet [id number]  Take 1 tablet (50 mg total) by mouth daily as needed. Lillard Anes, MD  Active   SitaGLIPtin-MetFORMIN HCl (JANUMET XR) [phone] MG TB24 [id number] Yes Take 1 tablet by mouth daily. Lillard Anes, MD Taking Active             Patient Active Problem List   Diagnosis Date Noted   BMI 32.0-32.9,adult 03/02/2021   Type 2 diabetes mellitus with vascular disease ([place]) 10/31/2020   Essential hypertension, benign 07/01/2020   Obesity, diabetes, and hypertension syndrome ([place]) 11/13/2019   Mixed hyperlipidemia 11/13/2019   Osteoarthritis of knee, unspecified 11/13/2019   Hemiparesis affecting right side as late effect of cerebrovascular accident ([place]) 11/13/2019   Erectile dysfunction due to arterial insufficiency 11/13/2019   Encounter for long-term opiate analgesic use 11/13/2019   Expected blood loss anemia 08/02/2012    Obese 08/02/2012   Hyponatremia 08/02/2012   S/P right TKA 08/01/2012   S/P Hardware removal right knee 06/20/2012     There is no immunization history on file for this patient.  Conditions to be addressed/monitored:  Hypertension, Hyperlipidemia, Diabetes, Osteoarthritis and erectile dysfunction  Care Plan : [place]  Updates made by Lane Hacker, RPH since 07/22/2021 12:00 AM     Problem: htn, hld, dm   Priority: High  Onset Date: 01/02/2021     Long-Range Goal: Disease State Management   Start Date: 01/02/2021  Expected End Date: 01/02/2022  Recent Progress: On track  Priority: High  Note:     Current Barriers:  Unable to achieve control of cholesterol   Pharmacist Clinical Goal(s):  Patient will achieve control of cholseterol as evidenced by lipid panel through collaboration with PharmD and provider.   Interventions: 1:1 collaboration with Lillard Anes, MD regarding development and update of comprehensive plan of care as evidenced by provider attestation and co-signature Inter-disciplinary care team collaboration (see longitudinal plan of care) Comprehensive medication review performed; medication list updated in electronic medical record  Hypertension (BP goal <130/80) BP Readings from Last 3 Encounters:  07/06/21 (!) 144/74  07/01/21 140/80  05/06/21 120/70  -Controlled -Current treatment: hydrochlorothiazide 25 mg daily Lisinopril 10 mg daily Metoprolol succinate 50 mg bid -Medications previously tried: none reported  -Current home readings: not checking  -Current dietary habits: fried seafood, eats at home during the week -Current exercise habits: limited to yard work -Denies hypotensive/hypertensive symptoms -Educated on BP goals and benefits of medications for prevention of heart attack, stroke and kidney damage; Daily salt intake goal < 2300 mg; -Counseled to monitor BP at home as needed, document, and provide log at  future appointments -Counseled on diet and exercise extensively Recommended to continue current medication  CAD:(LDL goal < 70) -Current treatment: atorvastatin 80 mg daily  Clopidogrel 75 mg daily  -Medications previously tried: none reported  -Current dietary patterns: no true "diet". Eats at home during the week mostly and enjoys fried seafood out on weekends.  -Current exercise habits: limited -  Educated on Cholesterol goals;  Benefits of statin for ASCVD risk reduction; Importance of limiting foods high in cholesterol; -Counseled on diet and exercise extensively Recommended to continue current medication  Diabetes (A1c goal <7%) -Controlled -Current medications: Janumet XR [phone] mg daily (Not taking due to cost) Ozempic (Not taking due to cost) -Medications previously tried: none reported -Current home glucose readings fasting glucose: 129 mg/dL post prandial glucose: not reported -Denies hypoglycemic/hyperglycemic symptoms -Current meal patterns:  breakfast: doesn't each much breakfast lunch: eats at home normally  dinner: seafood snacks: none reported drinks: diet green tea, coffee -Current exercise: limited  -Educated on A1c and blood sugar goals; Complications of diabetes including kidney damage, retinal damage, and cardiovascular disease; Benefits of weight loss; Benefits of routine self-monitoring of blood sugar; Carbohydrate counting and/or plate method -Counseled to check feet daily and get yearly eye exams -Counseled on diet and exercise extensively November 2022: Patient states he did a form for a med but he doesn't know what for. Will get PAP's for Ozempic and Janumet  Pain (Goal: manage symptoms of pain ) -Controlled -Current treatment  hydrocodone-acetaminophen 10-325 mg every 6 hours prn  -Medications previously tried: tramadol  -Recommended to continue current medication  Health Maintenance -Vaccine gaps: assess at future visits -Current  therapy:  sildenafil 50 mg daily prn  -Patient is satisfied with current therapy and denies issues -Recommended to continue current medication   Patient Goals/Self-Care Activities Patient will:  - take medications as prescribed focus on medication adherence by using pill box check glucose daily, document, and provide at future appointments engage in dietary modifications by limiting carbohydrate intake.   Follow Up Plan: Telephone follow up appointment with care management team member scheduled for: 03/2022  [place] Constable, Pharm.D. - [phone]       Medication Assistance: PAP's for Janumet and Ozempic started  Patient's preferred pharmacy is:  [facility] ([place] [place], [place] [address] Phone: [phone] Fax: [phone]  [facility] [place], [place] - [address] AT [address] [place] [address] Phone: [phone] Fax: [phone]  [facility] ([place], [place] [facility] [address] [place] [address] Phone: [phone] Fax: [place] [address], [place] - [place] [address] [place] [address] Phone: [phone] Fax: [phone]  Uses pill box? No - stores medications together  Pt endorses good compliance  We discussed: Current pharmacy is preferred with insurance plan and patient is satisfied with pharmacy services Patient decided to: Continue current medication management strategy  Care Gaps: Last annual wellness visit? Message sent to CMA to schedule If applicable: Last eye exam / retinopathy screening? 08/05/20 Diabetic foot exam? 10/31/20  Care Plan and Follow Up Patient Decision:  Patient agrees to Care Plan and Follow-up.  Plan: Telephone follow up appointment with care management team member scheduled  for:  03/2022  [place] Constable, Pharm.D. - [phone]

## 2021-07-22 NOTE — Telephone Encounter (Signed)
Patient declined AWV.  Recommended he come in the office for a BP check - patient declined that as well.

## 2021-07-29 ENCOUNTER — Telehealth: Payer: Self-pay

## 2021-07-29 NOTE — Chronic Care Management (AMB) (Signed)
    Chronic Care Management Pharmacy Assistant   Name: Marcus Klein  MRN: [id number] DOB: 1960/09/28   Reason for Encounter: Patient Assistance Documentation  07/29/21 I have filled out PAP's for Janumet XR and Ozempic through [employer] and [employer]. These applications have been uploaded to log to be mailed out to patient.    I tried to call pt and he did not answer or vm was set up.    Medications: Outpatient Encounter Medications as of 07/29/2021  Medication Sig   atorvastatin (LIPITOR) 80 MG tablet TAKE 1 TABLET BY MOUTH  DAILY   clopidogrel (PLAVIX) 75 MG tablet TAKE 1 TABLET BY MOUTH  DAILY   hydrochlorothiazide (HYDRODIURIL) 25 MG tablet TAKE 1 TABLET BY MOUTH  DAILY   HYDROcodone-acetaminophen (NORCO) 10-325 MG tablet Take 1 tablet by mouth every 6 (six) hours as needed.   lisinopril (ZESTRIL) 10 MG tablet TAKE 1 TABLET BY MOUTH  DAILY   metoprolol succinate (TOPROL-XL) 50 MG 24 hr tablet TAKE 1 TABLET BY MOUTH  TWICE DAILY   Semaglutide,0.25 or 0.5MG /DOS, (OZEMPIC, 0.25 OR 0.5 MG/DOSE,) 2 MG/1.5ML SOPN Inject 0.5 mg into the skin once a week. (Patient not taking: Reported on 07/22/2021)   sildenafil (VIAGRA) 50 MG tablet Take 1 tablet (50 mg total) by mouth daily as needed.   SitaGLIPtin-MetFORMIN HCl (JANUMET XR) [phone] MG TB24 Take 1 tablet by mouth daily.   No facility-administered encounter medications on file as of 07/29/2021.   [name], [place] Pharmacist Assistant  [phone]

## 2021-08-05 DIAGNOSIS — E1159 Type 2 diabetes mellitus with other circulatory complications: Secondary | ICD-10-CM

## 2021-08-05 DIAGNOSIS — E669 Obesity, unspecified: Secondary | ICD-10-CM | POA: Diagnosis not present

## 2021-08-05 DIAGNOSIS — E782 Mixed hyperlipidemia: Secondary | ICD-10-CM

## 2021-08-05 DIAGNOSIS — I10 Essential (primary) hypertension: Secondary | ICD-10-CM

## 2021-08-05 DIAGNOSIS — E1169 Type 2 diabetes mellitus with other specified complication: Secondary | ICD-10-CM | POA: Diagnosis not present

## 2021-08-05 DIAGNOSIS — I152 Hypertension secondary to endocrine disorders: Secondary | ICD-10-CM

## 2021-08-07 ENCOUNTER — Other Ambulatory Visit: Payer: Self-pay

## 2021-08-07 DIAGNOSIS — Z79891 Long term (current) use of opiate analgesic: Secondary | ICD-10-CM

## 2021-08-07 MED ORDER — HYDROCODONE-ACETAMINOPHEN 10-325 MG PO TABS: 1.0000 | ORAL_TABLET | Freq: Four times a day (QID) | ORAL | 0 refills | Status: DC | PRN

## 2021-08-25 ENCOUNTER — Other Ambulatory Visit: Payer: Self-pay

## 2021-08-25 DIAGNOSIS — N5201 Erectile dysfunction due to arterial insufficiency: Secondary | ICD-10-CM

## 2021-08-25 DIAGNOSIS — E1159 Type 2 diabetes mellitus with other circulatory complications: Secondary | ICD-10-CM

## 2021-08-25 MED ORDER — OZEMPIC (0.25 OR 0.5 MG/DOSE) 2 MG/1.5ML ~~LOC~~ SOPN: 0.5000 mg | PEN_INJECTOR | SUBCUTANEOUS | 3 refills | Status: DC

## 2021-08-25 MED ORDER — JANUMET XR 100-1000 MG PO TB24: 1.0000 | ORAL_TABLET | Freq: Every day | ORAL | 3 refills | Status: DC

## 2021-08-25 MED ORDER — JANUMET XR 100-1000 MG PO TB24
1.0000 | ORAL_TABLET | Freq: Every day | ORAL | 3 refills | Status: DC
Start: 2021-08-25 — End: 2021-08-25

## 2021-08-31 ENCOUNTER — Other Ambulatory Visit: Payer: Self-pay | Admitting: Legal Medicine

## 2021-09-09 ENCOUNTER — Telehealth: Payer: Self-pay

## 2021-09-09 ENCOUNTER — Other Ambulatory Visit: Payer: Self-pay

## 2021-09-09 DIAGNOSIS — Z79891 Long term (current) use of opiate analgesic: Secondary | ICD-10-CM

## 2021-09-09 MED ORDER — HYDROCODONE-ACETAMINOPHEN 10-325 MG PO TABS: 1.0000 | ORAL_TABLET | Freq: Four times a day (QID) | ORAL | 0 refills | Status: DC | PRN

## 2021-09-09 NOTE — Chronic Care Management (AMB) (Signed)
° ° °  Chronic Care Management Pharmacy Assistant   Name: LARENCE THONE  MRN: [id number] DOB: 09/19/1960   Reason for Encounter: Disease State call for DM   Recent office visits:  None  Recent consult visits:  None  Hospital visits:  None  Medications: Outpatient Encounter Medications as of 09/09/2021  Medication Sig   atorvastatin (LIPITOR) 80 MG tablet TAKE 1 TABLET BY MOUTH  DAILY   clopidogrel (PLAVIX) 75 MG tablet TAKE 1 TABLET BY MOUTH  DAILY   hydrochlorothiazide (HYDRODIURIL) 25 MG tablet TAKE 1 TABLET BY MOUTH  DAILY   HYDROcodone-acetaminophen (NORCO) 10-325 MG tablet Take 1 tablet by mouth every 6 (six) hours as needed.   lisinopril (ZESTRIL) 10 MG tablet TAKE 1 TABLET BY MOUTH  DAILY   metoprolol succinate (TOPROL-XL) 50 MG 24 hr tablet TAKE 1 TABLET BY MOUTH  TWICE DAILY   Semaglutide,0.25 or 0.5MG /DOS, (OZEMPIC, 0.25 OR 0.5 MG/DOSE,) 2 MG/1.5ML SOPN Inject 0.5 mg into the skin once a week.   sildenafil (VIAGRA) 50 MG tablet Take 1 tablet (50 mg total) by mouth daily as needed.   SitaGLIPtin-MetFORMIN HCl (JANUMET XR) [phone] MG TB24 Take 1 tablet by mouth daily.   No facility-administered encounter medications on file as of 09/09/2021.    Recent Relevant Labs: Lab Results  Component Value Date/Time   HGBA1C 6.8 (H) 07/06/2021 08:20 AM   HGBA1C 6.8 (H) 03/02/2021 07:54 AM   MICROALBUR 80 11/14/2019 08:17 AM    Kidney Function Lab Results  Component Value Date/Time   CREATININE 1.09 07/06/2021 08:20 AM   CREATININE 0.98 03/02/2021 07:54 AM   GFRNONAA 90 10/31/2020 08:00 AM   GFRAA 104 10/31/2020 08:00 AM     Current antihyperglycemic regimen:  Janumet XR [phone] mg daily Ozempic Not taking due to cost.   Note: PAP applications for both medications have been completed and pt stated he took them up to Dr. Tobie Poet to fax and is awaiting on approval.   Patient verbally confirms he is taking the above medications as directed. Yes  What recent  interventions/DTPs have been made to improve glycemic control:  No recent changes   Have there been any recent hospitalizations or ED visits since last visit with CPP? No  Patient denies hypoglycemic symptoms  Patient denies hyperglycemic symptoms  How often are you checking your blood sugar? once daily  What are your blood sugars ranging? 125  On insulin? No  During the week, how often does your blood glucose drop below 70? Never  Are you checking your feet daily/regularly? Yes  Adherence Review: Is the patient currently on a STATIN medication? Yes Is the patient currently on ACE/ARB medication? Yes Does the patient have >5 day gap between last estimated fill dates? CPP to review  Care Gaps: Last eye exam / Retinopathy Screening? 08/05/20 Last Annual Wellness Visit? None noted  Last Diabetic Foot Exam? 03/02/21   Star Rating Drugs:  Medication:  Last Fill: Day Supply Ozempic (Awaiting on PAP)    [place] Clinical Pharmacist Assitant [phone]

## 2021-09-11 NOTE — Telephone Encounter (Signed)
Called patient and emphasized that Januvia and Ozempic can't be taken together. If one gets approved, we'll have to DC the other one. Told him to call me whenever he gets approved for either or.  Scheduled f/u for Feb to go over new meds

## 2021-10-07 ENCOUNTER — Telehealth: Payer: Self-pay

## 2021-10-07 NOTE — Progress Notes (Signed)
Pt confirmed appt Friday with CPP.  [name], [place] Pharmacist Assistant  [phone]

## 2021-10-09 ENCOUNTER — Ambulatory Visit (INDEPENDENT_AMBULATORY_CARE_PROVIDER_SITE_OTHER): Payer: Medicare Other

## 2021-10-09 ENCOUNTER — Other Ambulatory Visit: Payer: Self-pay

## 2021-10-09 DIAGNOSIS — E782 Mixed hyperlipidemia: Secondary | ICD-10-CM

## 2021-10-09 DIAGNOSIS — E1159 Type 2 diabetes mellitus with other circulatory complications: Secondary | ICD-10-CM

## 2021-10-09 DIAGNOSIS — I10 Essential (primary) hypertension: Secondary | ICD-10-CM

## 2021-10-09 DIAGNOSIS — Z79891 Long term (current) use of opiate analgesic: Secondary | ICD-10-CM

## 2021-10-09 MED ORDER — HYDROCODONE-ACETAMINOPHEN 10-325 MG PO TABS: 1.0000 | ORAL_TABLET | Freq: Four times a day (QID) | ORAL | 0 refills | Status: DC | PRN

## 2021-10-09 NOTE — Patient Instructions (Signed)
Visit Information   Goals Addressed   None    Patient Care Plan: CCM Pharmacy Care Plan     Problem Identified: htn, hld, dm   Priority: High  Onset Date: 01/02/2021     Long-Range Goal: Disease State Management   Start Date: 01/02/2021  Expected End Date: 01/02/2022  Recent Progress: On track  Priority: High  Note:     Current Barriers:  Unable to achieve control of cholesterol   Pharmacist Clinical Goal(s):  Patient will achieve control of cholseterol as evidenced by lipid panel through collaboration with PharmD and provider.   Interventions: 1:1 collaboration with Lillard Anes, MD regarding development and update of comprehensive plan of care as evidenced by provider attestation and co-signature Inter-disciplinary care team collaboration (see longitudinal plan of care) Comprehensive medication review performed; medication list updated in electronic medical record  Hypertension (BP goal <130/80) BP Readings from Last 3 Encounters:  07/06/21 (!) 144/74  07/01/21 140/80  05/06/21 120/70  -Controlled -Current treatment: hydrochlorothiazide 25 mg daily Appropriate, Effective, Safe, Accessible Lisinopril 10 mg daily Appropriate, Effective, Safe, Accessible Metoprolol succinate 50 mg bid Appropriate, Effective, Safe, Accessible -Medications previously tried: none reported  -Current home readings: not checking  -Current dietary habits: fried seafood, eats at home during the week -Current exercise habits: limited to yard work -Denies hypotensive/hypertensive symptoms -Educated on BP goals and benefits of medications for prevention of heart attack, stroke and kidney damage; Daily salt intake goal < 2300 mg; -Counseled to monitor BP at home as needed, document, and provide log at future appointments -Counseled on diet and exercise extensively Recommended to continue current medication  CAD:(LDL goal < 70) -Current treatment: atorvastatin 80 mg daily Appropriate,  Effective, Safe, Accessible Clopidogrel 75 mg daily Appropriate, Effective, Safe, Accessible -Medications previously tried: none reported  -Current dietary patterns: no true "diet". Eats at home during the week mostly and enjoys fried seafood out on weekends.  -Current exercise habits: limited -Educated on Cholesterol goals;  Benefits of statin for ASCVD risk reduction; Importance of limiting foods high in cholesterol; -Counseled on diet and exercise extensively Recommended to continue current medication  Diabetes (A1c goal <7%) -Controlled -Current medications: Janumet XR [phone] mg daily (Not taking due to cost) Query Appropriate, Query effective, Query Safe, Query accessible Ozempic (Not taking due to cost) Query Appropriate, Query effective, Query Safe, Query accessible -Medications previously tried: Metformin IR (Bad diarrhea but was fine on Janumet XR) -Current home glucose readings fasting glucose:  November 2022: 129 mg/dL Feb 2023: 130's per patient on Janumet XR only post prandial glucose: not reported -Denies hypoglycemic/hyperglycemic symptoms -Current meal patterns:  breakfast: doesn't each much breakfast lunch: eats at home normally  dinner: seafood snacks: none reported drinks: diet green tea, coffee -Current exercise: limited  -Educated on A1c and blood sugar goals; Complications of diabetes including kidney damage, retinal damage, and cardiovascular disease; Benefits of weight loss; Benefits of routine self-monitoring of blood sugar; Carbohydrate counting and/or plate method -Counseled to check feet daily and get yearly eye exams -Counseled on diet and exercise extensively November 2022: Patient states he did a form for a med but he doesn't know what for. Will get PAP's for Ozempic and Janumet Feb 2023: Still hasn't heard from company. Gave him Phone number to call for PAP's. Will ask PCP to send in Metformin ER since patient needs medication and is about to  run out of Janumet XR. This will benefit too because if Ozempic is approved, patient should DC Januvia  Pain (Goal: manage symptoms of pain ) -Controlled Pain Control  Feb 2023: Didn't specify number but patient is very content on therapy -Current treatment  hydrocodone-acetaminophen 10-325 mg every 6 hours prn Appropriate, Effective, Safe, Accessible -Medications previously tried: tramadol  -Recommended to continue current medication  Health Maintenance -Vaccine gaps: assess at future visits -Current therapy:  sildenafil 50 mg daily prn Appropriate, Effective, Safe, Accessible -Patient is satisfied with current therapy and denies issues -Recommended to continue current medication   Patient Goals/Self-Care Activities Patient will:  - take medications as prescribed focus on medication adherence by using pill box check glucose daily, document, and provide at future appointments engage in dietary modifications by limiting carbohydrate intake.   Follow Up Plan: Telephone follow up appointment with care management team member scheduled for: April 2023  Arizona Constable, [place].D. - [phone]        Mr. Bonafede was given information about Chronic Care Management services today including:  CCM service includes personalized support from designated clinical staff supervised by his physician, including individualized plan of care and coordination with other care providers 24/7 contact phone numbers for assistance for urgent and routine care needs. Standard insurance, coinsurance, copays and deductibles apply for chronic care management only during months in which we provide at least 20 minutes of these services. Most insurances cover these services at 100%, however patients may be responsible for any copay, coinsurance and/or deductible if applicable. This service may help you avoid the need for more expensive face-to-face services. Only one practitioner may furnish and bill the service  in a calendar month. The patient may stop CCM services at any time (effective at the end of the month) by phone call to the office staff.  Patient agreed to services and verbal consent obtained.   The patient verbalized understanding of instructions, educational materials, and care plan provided today and declined offer to receive copy of patient instructions, educational materials, and care plan.  The pharmacy team will reach out to the patient again over the next 60 days.   Lane Hacker, [place]

## 2021-10-09 NOTE — Progress Notes (Signed)
Chronic Care Management Pharmacy Note  10/09/2021 Name:  Marcus Klein MRN:  [id number] DOB:  [birth year]   Recommendations:  Janumet XR=$400. Ask PCP to send Metformin 565m ER 2BID so he has medical therapy for sugars. If Ozempic PAP approved, patient will have to DC Januvia in Janumet XR anways. Patient very bad ADR's to Metformin IR but no problems with ER version   Subjective: Marcus GUTMANis an 61y.o. year old male who is a primary patient of Marcus Klein [facility].  The CCM team was consulted for assistance with disease management and care coordination needs.    Engaged with patient by telephone for follow up visit in response to provider referral for pharmacy case management and/or care coordination services.   Consent to Services:  The patient was given the following information about Chronic Care Management services today, agreed to services, and gave verbal consent: 1. CCM service includes personalized support from designated clinical staff supervised by the primary care provider, including individualized plan of care and coordination with other care providers 2. 24/7 contact phone numbers for assistance for urgent and routine care needs. 3. Service will only be billed when office clinical staff spend 20 minutes or more in a month to coordinate care. 4. Only one practitioner may furnish and bill the service in a calendar month. 5.The patient may stop CCM services at any time (effective at the end of the month) by phone call to the office staff. 6. The patient will be responsible for cost sharing (co-pay) of up to 20% of the service fee (after annual deductible is met). Patient agreed to services and consent obtained.  Patient Care Team: Marcus Klein as PCP - General (Family Medicine) [facility](Pharmacist)  Recent office visits:  05/06/21- Marcus Klein- seen for hemaparesis affetcting right side as late effect of CVA, discussed robotic  prothesis, follow up 1 month for check on robotic progress   Recent consult visits:  No visits noted   Hospital visits:  None in previous 6 months  Objective:  Lab Results  Component Value Date   CREATININE 1.09 07/06/2021   BUN 22 07/06/2021   GFRNONAA 90 10/31/2020   GFRAA 104 10/31/2020   NA 138 07/06/2021   K 4.4 07/06/2021   CALCIUM 9.8 07/06/2021   CO2 24 07/06/2021   GLUCOSE 144 (H) 07/06/2021    Lab Results  Component Value Date/Time   HGBA1C 6.8 (H) 07/06/2021 08:20 AM   HGBA1C 6.8 (H) 03/02/2021 07:54 AM   MICROALBUR 80 11/14/2019 08:17 AM    Last diabetic Eye exam: No results found for: HMDIABEYEEXA  Last diabetic Foot exam: No results found for: HMDIABFOOTEX   Lab Results  Component Value Date   CHOL 184 07/06/2021   HDL 38 (L) 07/06/2021   LDLCALC 104 (H) 07/06/2021   TRIG 243 (H) 07/06/2021   CHOLHDL 4.8 07/06/2021    Hepatic Function Latest Ref Rng & Units 07/06/2021 03/02/2021 10/31/2020  Total Protein 6.0 - 8.5 g/dL 7.2 7.0 7.4  Albumin 3.8 - 4.9 g/dL 4.8 4.6 4.5  AST 0 - 40 IU/L 20 18 21   ALT 0 - 44 IU/L 27 26 30   Alk Phosphatase 44 - 121 IU/L 130(H) 123(H) 125(H)  Total Bilirubin 0.0 - 1.2 mg/dL 0.5 0.4 0.6    No results found for: TSH, FREET4  CBC Latest Ref Rng & Units 07/06/2021 03/02/2021 10/31/2020  WBC 3.4 - 10.8 x10E3/uL 8.1 6.5 7.0  Hemoglobin  13.0 - 17.7 g/dL 15.7 14.8 16.0  Hematocrit 37.5 - 51.0 % 47.6 45.5 47.7  Platelets 150 - 450 x10E3/uL 272 249 266    No results found for: VD25OH  Clinical ASCVD: No  The 10-year ASCVD risk score (Arnett DK, et al., 2019) is: 24.9%   Values used to calculate the score:     Age: 61 years     Sex: Male     Is Non-Hispanic African American: No     Diabetic: Yes     Tobacco smoker: No     Systolic Blood Pressure: 415 mmHg     Is BP treated: Yes     HDL Cholesterol: 38 mg/dL     Total Cholesterol: 184 mg/dL    Depression screen PHQ 2/9 03/02/2021  Decreased Interest 0  Down,  Depressed, Hopeless 0  PHQ - 2 Score 0      Social History   Tobacco Use  Smoking Status Never  Smokeless Tobacco Never   BP Readings from Last 3 Encounters:  07/06/21 (!) 144/74  07/01/21 140/80  05/06/21 120/70   Pulse Readings from Last 3 Encounters:  07/06/21 63  07/01/21 66  05/06/21 (!) 57   Wt Readings from Last 3 Encounters:  07/06/21 224 lb 3.2 oz (101.7 kg)  07/01/21 224 lb (101.6 kg)  05/06/21 223 lb (101.2 kg)   BMI Readings from Last 3 Encounters:  07/06/21 32.17 kg/m  07/01/21 32.14 kg/m  05/06/21 32.00 kg/m    Assessment/Interventions: Review of patient past medical history, allergies, medications, health status, including review of consultants reports, laboratory and other test data, was performed as part of comprehensive evaluation and provision of chronic care management services.   SDOH:  (Social Determinants of Health) assessments and interventions performed: Yes SDOH Interventions    Flowsheet Row Most Recent Value  SDOH Interventions   Financial Strain Interventions Other (Comment)  [PAP's completed]  Transportation Interventions Intervention Not Indicated      SDOH Screenings   Alcohol Screen: Not on file  Depression (PHQ2-9): Low Risk    PHQ-2 Score: 0  Financial Resource Strain: High Risk   Difficulty of Paying Living Expenses: Hard  Food Insecurity: No Food Insecurity   Worried About Charity fundraiser in the Last Year: Never true   Ran Out of Food in the Last Year: Never true  Housing: Low Risk    Last Housing Risk Score: 0  Physical Activity: Not on file  Social Connections: Not on file  Stress: Not on file  Tobacco Use: Low Risk    Smoking Tobacco Use: Never   Smokeless Tobacco Use: Never   Passive Exposure: Not on file  Transportation Needs: No Transportation Needs   Lack of Transportation (Medical): No   Lack of Transportation (Non-Medical): No    CCM Care Plan  Allergies  Allergen Reactions   Tramadol Other  (See Comments)    Medications Reviewed Today     Reviewed by Lane Hacker, [facility] (Pharmacist) on 10/09/21 at 0857  Med List Status: <None>   Medication Order Taking? Sig Documenting Provider Last Dose Status Informant  atorvastatin (LIPITOR) 80 MG tablet [id number] Yes TAKE 1 TABLET BY MOUTH  DAILY Lillard Anes, Klein Taking Active   clopidogrel (PLAVIX) 75 MG tablet [id number] Yes TAKE 1 TABLET BY MOUTH  DAILY Lillard Anes, Klein Taking Active   hydrochlorothiazide (HYDRODIURIL) 25 MG tablet [id number] Yes TAKE 1 TABLET BY MOUTH  DAILY Lillard Anes, Klein Taking Active  HYDROcodone-acetaminophen (NORCO) 10-325 MG tablet [id number] Yes Take 1 tablet by mouth every 6 (six) hours as needed. Lillard Anes, Klein Taking Active   lisinopril (ZESTRIL) 10 MG tablet [id number] Yes TAKE 1 TABLET BY MOUTH  DAILY Lillard Anes, Klein Taking Active   metoprolol succinate (TOPROL-XL) 50 MG 24 hr tablet [id number] Yes TAKE 1 TABLET BY MOUTH  TWICE DAILY Lillard Anes, Klein Taking Active   Semaglutide,0.25 or 0.5MG/DOS, (OZEMPIC, 0.25 OR 0.5 MG/DOSE,) 2 MG/1.5ML SOPN [id number] No Inject 0.5 mg into the skin once a week.  Patient not taking: Reported on 10/09/2021   Lillard Anes, Klein Not Taking Active   sildenafil (VIAGRA) 50 MG tablet [id number] Yes Take 1 tablet (50 mg total) by mouth daily as needed. Lillard Anes, Klein Taking Active   SitaGLIPtin-MetFORMIN HCl (JANUMET XR) 650 610 5206 MG TB24 [id number] Yes Take 1 tablet by mouth daily. Lillard Anes, Klein Taking Active             Patient Active Problem List   Diagnosis Date Noted   BMI 32.0-32.9,adult 03/02/2021   Type 2 diabetes mellitus with vascular disease ([place]) 10/31/2020   Essential hypertension, benign 07/01/2020   Obesity, diabetes, and hypertension syndrome ([place]) 11/13/2019   Mixed hyperlipidemia 11/13/2019   Osteoarthritis of knee, unspecified 11/13/2019   Hemiparesis affecting  right side as late effect of cerebrovascular accident ([place]) 11/13/2019   Erectile dysfunction due to arterial insufficiency 11/13/2019   Encounter for long-term opiate analgesic use 11/13/2019   Expected blood loss anemia 08/02/2012   Obese 08/02/2012   Hyponatremia 08/02/2012   S/P right TKA 08/01/2012   S/P Hardware removal right knee 06/20/2012     There is no immunization history on file for this patient.  Conditions to be addressed/monitored:  Hypertension, Hyperlipidemia, Diabetes, Osteoarthritis and erectile dysfunction  Care Plan : [place]  Updates made by Lane Hacker, RPH since 10/09/2021 12:00 AM     Problem: htn, hld, dm   Priority: High  Onset Date: 01/02/2021     Long-Range Goal: Disease State Management   Start Date: 01/02/2021  Expected End Date: 01/02/2022  Recent Progress: On track  Priority: High  Note:     Current Barriers:  Unable to achieve control of cholesterol   Pharmacist Clinical Goal(s):  Patient will achieve control of cholseterol as evidenced by lipid panel through collaboration with PharmD and provider.   Interventions: 1:1 collaboration with Lillard Anes, Klein regarding development and update of comprehensive plan of care as evidenced by provider attestation and co-signature Inter-disciplinary care team collaboration (see longitudinal plan of care) Comprehensive medication review performed; medication list updated in electronic medical record  Hypertension (BP goal <130/80) BP Readings from Last 3 Encounters:  07/06/21 (!) 144/74  07/01/21 140/80  05/06/21 120/70  -Controlled -Current treatment: hydrochlorothiazide 25 mg daily Appropriate, Effective, Safe, Accessible Lisinopril 10 mg daily Appropriate, Effective, Safe, Accessible Metoprolol succinate 50 mg bid Appropriate, Effective, Safe, Accessible -Medications previously tried: none reported  -Current home readings: not checking  -Current dietary habits:  fried seafood, eats at home during the week -Current exercise habits: limited to yard work -Denies hypotensive/hypertensive symptoms -Educated on BP goals and benefits of medications for prevention of heart attack, stroke and kidney damage; Daily salt intake goal < 2300 mg; -Counseled to monitor BP at home as needed, document, and provide log at future appointments -Counseled on diet and exercise extensively Recommended to continue current medication  CAD:(LDL goal < 70) -  Current treatment: atorvastatin 80 mg daily Appropriate, Effective, Safe, Accessible Clopidogrel 75 mg daily Appropriate, Effective, Safe, Accessible -Medications previously tried: none reported  -Current dietary patterns: no true "diet". Eats at home during the week mostly and enjoys fried seafood out on weekends.  -Current exercise habits: limited -Educated on Cholesterol goals;  Benefits of statin for ASCVD risk reduction; Importance of limiting foods high in cholesterol; -Counseled on diet and exercise extensively Recommended to continue current medication  Diabetes (A1c goal <7%) -Controlled -Current medications: Janumet XR [phone] mg daily (Not taking due to cost) Query Appropriate, Query effective, Query Safe, Query accessible Ozempic (Not taking due to cost) Query Appropriate, Query effective, Query Safe, Query accessible -Medications previously tried: Metformin IR (Bad diarrhea but was fine on Janumet XR) -Current home glucose readings fasting glucose:  November 2022: 129 mg/dL Feb 2023: 130's per patient on Janumet XR only post prandial glucose: not reported -Denies hypoglycemic/hyperglycemic symptoms -Current meal patterns:  breakfast: doesn't each much breakfast lunch: eats at home normally  dinner: seafood snacks: none reported drinks: diet green tea, coffee -Current exercise: limited  -Educated on A1c and blood sugar goals; Complications of diabetes including kidney damage, retinal damage,  and cardiovascular disease; Benefits of weight loss; Benefits of routine self-monitoring of blood sugar; Carbohydrate counting and/or plate method -Counseled to check feet daily and get yearly eye exams -Counseled on diet and exercise extensively November 2022: Patient states he did a form for a med but he doesn't know what for. Will get PAP's for Ozempic and Janumet Feb 2023: Still hasn't heard from company. Gave him Phone number to call for PAP's. Will ask PCP to send in Metformin ER since patient needs medication and is about to run out of Janumet XR. This will benefit too because if Ozempic is approved, patient should DC Januvia  Pain (Goal: manage symptoms of pain ) -Controlled Pain Control  Feb 2023: Didn't specify number but patient is very content on therapy -Current treatment  hydrocodone-acetaminophen 10-325 mg every 6 hours prn Appropriate, Effective, Safe, Accessible -Medications previously tried: tramadol  -Recommended to continue current medication  Health Maintenance -Vaccine gaps: assess at future visits -Current therapy:  sildenafil 50 mg daily prn Appropriate, Effective, Safe, Accessible -Patient is satisfied with current therapy and denies issues -Recommended to continue current medication   Patient Goals/Self-Care Activities Patient will:  - take medications as prescribed focus on medication adherence by using pill box check glucose daily, document, and provide at future appointments engage in dietary modifications by limiting carbohydrate intake.   Follow Up Plan: Telephone follow up appointment with care management team member scheduled for: April 2023  Arizona Constable, [place].D. - [phone]         Medication Assistance: PAP's for Janumet and Ozempic started  Patient's preferred pharmacy is:  [facility] ([place]) [place], [place] [address] Phone: [phone] Fax:  [phone]  [facility] [place], [place] - [address] [place] [address] Phone: [phone] Fax: [phone]  [facility] ([place], [facility] [address] [place] [address] Phone: [phone] Fax: [place], [place] [address] [place] [address] Phone: [phone] Fax: [phone]  [facility] ([facility] ) - [place], [place]  [address] [place] [address] Phone: [phone] Fax: [phone]   Uses pill box? No - stores medications together  Pt endorses good compliance  We discussed: Current pharmacy is preferred with insurance plan and patient is satisfied with pharmacy services Patient decided to: Continue current medication management strategy  Care Gaps: Last annual wellness visit? Message sent to CMA to schedule If applicable: Last eye exam / retinopathy screening? 08/05/20 Diabetic foot exam? 10/31/20  Care Plan and Follow Up Patient Decision:  Patient agrees to Care Plan and Follow-up.  Plan: Telephone follow up appointment with care management team member scheduled for:  12/2021  [place] Constable, Pharm.D. - [phone]

## 2021-10-13 DIAGNOSIS — E118 Type 2 diabetes mellitus with unspecified complications: Secondary | ICD-10-CM | POA: Diagnosis not present

## 2021-10-17 ENCOUNTER — Other Ambulatory Visit: Payer: Self-pay | Admitting: Legal Medicine

## 2021-10-17 DIAGNOSIS — I152 Hypertension secondary to endocrine disorders: Secondary | ICD-10-CM

## 2021-10-17 DIAGNOSIS — E1159 Type 2 diabetes mellitus with other circulatory complications: Secondary | ICD-10-CM

## 2021-11-03 DIAGNOSIS — E1159 Type 2 diabetes mellitus with other circulatory complications: Secondary | ICD-10-CM | POA: Diagnosis not present

## 2021-11-03 DIAGNOSIS — E782 Mixed hyperlipidemia: Secondary | ICD-10-CM | POA: Diagnosis not present

## 2021-11-03 DIAGNOSIS — I10 Essential (primary) hypertension: Secondary | ICD-10-CM | POA: Diagnosis not present

## 2021-11-05 ENCOUNTER — Other Ambulatory Visit: Payer: Self-pay

## 2021-11-05 ENCOUNTER — Ambulatory Visit (INDEPENDENT_AMBULATORY_CARE_PROVIDER_SITE_OTHER): Payer: Medicare Other | Admitting: Legal Medicine

## 2021-11-05 ENCOUNTER — Encounter: Payer: Self-pay | Admitting: Legal Medicine

## 2021-11-05 VITALS — BP 104/76 | HR 50 | Temp 98.0°F | Ht 70.0 in | Wt 226.4 lb

## 2021-11-05 DIAGNOSIS — R001 Bradycardia, unspecified: Secondary | ICD-10-CM | POA: Diagnosis not present

## 2021-11-05 DIAGNOSIS — E1159 Type 2 diabetes mellitus with other circulatory complications: Secondary | ICD-10-CM

## 2021-11-05 DIAGNOSIS — Z6832 Body mass index (BMI) 32.0-32.9, adult: Secondary | ICD-10-CM

## 2021-11-05 DIAGNOSIS — E782 Mixed hyperlipidemia: Secondary | ICD-10-CM

## 2021-11-05 DIAGNOSIS — I10 Essential (primary) hypertension: Secondary | ICD-10-CM

## 2021-11-05 DIAGNOSIS — I69351 Hemiplegia and hemiparesis following cerebral infarction affecting right dominant side: Secondary | ICD-10-CM | POA: Diagnosis not present
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
## 2021-11-06 LAB — COMPREHENSIVE METABOLIC PANEL
ALT: 31 IU/L (ref 0–44)
AST: 22 IU/L (ref 0–40)
Albumin/Globulin Ratio: 1.7 (ref 1.2–2.2)
Albumin: 4.5 g/dL (ref 3.8–4.9)
Alkaline Phosphatase: 126 IU/L — ABNORMAL HIGH (ref 44–121)
BUN/Creatinine Ratio: 24 (ref 10–24)
BUN: 24 mg/dL (ref 8–27)
Bilirubin Total: 0.4 mg/dL (ref 0.0–1.2)
CO2: 26 mmol/L (ref 20–29)
Calcium: 9.2 mg/dL (ref 8.6–10.2)
Chloride: 100 mmol/L (ref 96–106)
Creatinine, Ser: 1.01 mg/dL (ref 0.76–1.27)
Globulin, Total: 2.6 g/dL (ref 1.5–4.5)
Glucose: 143 mg/dL — ABNORMAL HIGH (ref 70–99)
Potassium: 4 mmol/L (ref 3.5–5.2)
Sodium: 140 mmol/L (ref 134–144)
Total Protein: 7.1 g/dL (ref 6.0–8.5)
eGFR: 85 mL/min/{1.73_m2} (ref >59)

## 2021-11-06 LAB — CBC WITH DIFFERENTIAL/PLATELET
Basophils Absolute: 0.1 10*3/uL (ref 0.0–0.2)
Basos: 1 %
EOS (ABSOLUTE): 0.2 10*3/uL (ref 0.0–0.4)
Eos: 2 %
Hematocrit: 45.9 % (ref 37.5–51.0)
Hemoglobin: 15.6 g/dL (ref 13.0–17.7)
Immature Grans (Abs): 0 10*3/uL (ref 0.0–0.1)
Immature Granulocytes: 0 %
Lymphocytes Absolute: 2.3 10*3/uL (ref 0.7–3.1)
Lymphs: 31 %
MCH: 29.8 pg (ref 26.6–33.0)
MCHC: 34 g/dL (ref 31.5–35.7)
MCV: 88 fL (ref 79–97)
Monocytes Absolute: 0.9 10*3/uL (ref 0.1–0.9)
Monocytes: 12 %
Neutrophils Absolute: 3.9 10*3/uL (ref 1.4–7.0)
Neutrophils: 54 %
Platelets: 272 10*3/uL (ref 150–450)
RBC: 5.23 x10E6/uL (ref 4.14–5.80)
RDW: 12.9 % (ref 11.6–15.4)
WBC: 7.2 10*3/uL (ref 3.4–10.8)

## 2021-11-06 LAB — LIPID PANEL
Chol/HDL Ratio: 4.8 ratio (ref 0.0–5.0)
Cholesterol, Total: 177 mg/dL (ref 100–199)
HDL: 37 mg/dL — ABNORMAL LOW (ref >39)
LDL Chol Calc (NIH): 113 mg/dL — ABNORMAL HIGH (ref 0–99)
Triglycerides: 154 mg/dL — ABNORMAL HIGH (ref 0–149)
VLDL Cholesterol Cal: 27 mg/dL (ref 5–40)

## 2021-11-06 LAB — CARDIOVASCULAR RISK ASSESSMENT

## 2021-11-06 LAB — HEMOGLOBIN A1C
Est. average glucose Bld gHb Est-mCnc: 166 mg/dL
Hgb A1c MFr Bld: 7.4 % — ABNORMAL HIGH (ref 4.8–5.6)

## 2021-11-06 NOTE — Progress Notes (Signed)
A1c 7.4, glucose 143, kidney tests normal, liver tests normal, triglycerides still high 154, LDL cholesterol 113 high is he taking atorvastatin 80mg  ?, CBC normal,  ?lp

## 2021-11-09 ENCOUNTER — Other Ambulatory Visit: Payer: Self-pay

## 2021-11-09 MED ORDER — ROSUVASTATIN CALCIUM 20 MG PO TABS: 20.0000 mg | ORAL_TABLET | Freq: Every day | ORAL | 1 refills | Status: DC

## 2021-11-11 ENCOUNTER — Telehealth: Payer: Self-pay

## 2021-11-11 NOTE — Chronic Care Management (AMB) (Signed)
? ? ?  Chronic Care Management ?Pharmacy Assistant  ? ?Name: Marcus Klein  MRN: [id number] DOB: 11-12-1960 ? ? ?Reason for Encounter: Disease State call for DM  ?  ?Recent office visits:  ?11/05/21 Marcus Meeker MD. Seen for HLD, DM and HTN. No med changes.  ? ?Recent consult visits:  ?None ? ?Hospital visits:  ?None ? ?Medications: ?Outpatient Encounter Medications as of 11/11/2021  ?Medication Sig  ? clopidogrel (PLAVIX) 75 MG tablet TAKE 1 TABLET BY MOUTH  DAILY  ? hydrochlorothiazide (HYDRODIURIL) 25 MG tablet TAKE 1 TABLET BY MOUTH  DAILY  ? HYDROcodone-acetaminophen (NORCO) 10-325 MG tablet Take 1 tablet by mouth every 6 (six) hours as needed.  ? JANUMET XR [phone] MG TB24 TAKE 1 TABLET BY MOUTH  DAILY  ? lisinopril (ZESTRIL) 10 MG tablet TAKE 1 TABLET BY MOUTH  DAILY  ? metoprolol succinate (TOPROL-XL) 50 MG 24 hr tablet TAKE 1 TABLET BY MOUTH  TWICE DAILY  ? rosuvastatin (CRESTOR) 20 MG tablet Take 1 tablet (20 mg total) by mouth daily.  ? Semaglutide,0.25 or 0.'5MG'$ /DOS, (OZEMPIC, 0.25 OR 0.5 MG/DOSE,) 2 MG/1.5ML SOPN Inject 0.5 mg into the skin once a week.  ? sildenafil (VIAGRA) 50 MG tablet Take 1 tablet (50 mg total) by mouth daily as needed.  ? ?No facility-administered encounter medications on file as of 11/11/2021.  ? ? ?Recent Relevant Labs: ?Lab Results  ?Component Value Date/Time  ? HGBA1C 7.4 (H) 11/05/2021 08:03 AM  ? HGBA1C 6.8 (H) 07/06/2021 08:20 AM  ? MICROALBUR 80 11/14/2019 08:17 AM  ?  ?Kidney Function ?Lab Results  ?Component Value Date/Time  ? CREATININE 1.01 11/05/2021 08:03 AM  ? CREATININE 1.09 07/06/2021 08:20 AM  ? GFRNONAA 90 10/31/2020 08:00 AM  ? GFRAA 104 10/31/2020 08:00 AM  ? ? ? ?Current antihyperglycemic regimen:  ?Janumet XR 100-'1000mg'$  daily  ?Ozempic 0.'5mg'$ - 0.5 mg weekly ?Patient verbally confirms he is taking the above medications as directed. Yes ? ?What recent interventions/DTPs have been made to improve glycemic control:  ?Pt denies any changes ? ?Have there been any  recent hospitalizations or ED visits since last visit with CPP? No ? ?Patient denies hypoglycemic symptoms, including None ? ?Patient denies hyperglycemic symptoms, including none ? ?How often are you checking your blood sugar? once daily ? ?What are your blood sugars ranging?  ?Fasting: 130 on 11/11/21 ? ?On insulin? No ? ?During the week, how often does your blood glucose drop below 70? Never ? ?Are you checking your feet daily/regularly? Yes ? ?Adherence Review: ?Is the patient currently on a STATIN medication? Yes ?Is the patient currently on ACE/ARB medication? Yes ?Does the patient have >5 day gap between last estimated fill dates? CPP to review ? ?Care Gaps: ?Last eye exam / Retinopathy Screening? 08/05/20 ?Last Annual Wellness Visit? None noted  ?Last Diabetic Foot Exam? 11/05/21 ? ? ?Star Rating Drugs:  ?Medication:  Last Fill: Day Supply ?Janumet  (PAP) ?Ozempic (PAP) ? ? ?Marcus Klein, CMA ?Clinical Pharmacist Assistant  ?[phone]  ?

## 2021-11-12 ENCOUNTER — Other Ambulatory Visit: Payer: Self-pay

## 2021-11-12 DIAGNOSIS — Z79891 Long term (current) use of opiate analgesic: Secondary | ICD-10-CM

## 2021-11-12 MED ORDER — HYDROCODONE-ACETAMINOPHEN 10-325 MG PO TABS: 1.0000 | ORAL_TABLET | Freq: Four times a day (QID) | ORAL | 0 refills | Status: DC | PRN

## 2021-11-19 ENCOUNTER — Telehealth: Payer: Self-pay

## 2021-11-19 NOTE — Chronic Care Management (AMB) (Signed)
? ? ?  Chronic Care Management ?Pharmacy Assistant  ? ?Name: Marcus Klein  MRN: [id number] DOB: Feb 08, 1961 ? ?Reason for Encounter: Patient Assistance Coordination ? ? 11/19/2021- Re-enrollment application filled out for patient's Ozempic with [employer] patient assistance program. Mailing applicaton for patient sign and return with updated income documentation to PCP office.  ? ? ?Medications: ?Outpatient Encounter Medications as of 11/19/2021  ?Medication Sig  ? clopidogrel (PLAVIX) 75 MG tablet TAKE 1 TABLET BY MOUTH  DAILY  ? hydrochlorothiazide (HYDRODIURIL) 25 MG tablet TAKE 1 TABLET BY MOUTH  DAILY  ? HYDROcodone-acetaminophen (NORCO) 10-325 MG tablet Take 1 tablet by mouth every 6 (six) hours as needed.  ? JANUMET XR [phone] MG TB24 TAKE 1 TABLET BY MOUTH  DAILY  ? lisinopril (ZESTRIL) 10 MG tablet TAKE 1 TABLET BY MOUTH  DAILY  ? metoprolol succinate (TOPROL-XL) 50 MG 24 hr tablet TAKE 1 TABLET BY MOUTH  TWICE DAILY  ? rosuvastatin (CRESTOR) 20 MG tablet Take 1 tablet (20 mg total) by mouth daily.  ? Semaglutide,0.25 or 0.'5MG'$ /DOS, (OZEMPIC, 0.25 OR 0.5 MG/DOSE,) 2 MG/1.5ML SOPN Inject 0.5 mg into the skin once a week.  ? sildenafil (VIAGRA) 50 MG tablet Take 1 tablet (50 mg total) by mouth daily as needed.  ? ?No facility-administered encounter medications on file as of 11/19/2021.  ? ? ?[name], CMA ?Clinical Pharmacist Assistant ?[phone] ? ?

## 2021-12-11 ENCOUNTER — Telehealth: Payer: Medicare Other

## 2021-12-11 ENCOUNTER — Other Ambulatory Visit: Payer: Self-pay

## 2021-12-11 DIAGNOSIS — Z79891 Long term (current) use of opiate analgesic: Secondary | ICD-10-CM

## 2021-12-11 MED ORDER — HYDROCODONE-ACETAMINOPHEN 10-325 MG PO TABS: 1.0000 | ORAL_TABLET | Freq: Four times a day (QID) | ORAL | 0 refills | Status: DC | PRN

## 2021-12-11 NOTE — Telephone Encounter (Signed)
Patient called requesting refill of pain medication be sent to [facility] in [place] ?

## 2022-01-05 ENCOUNTER — Telehealth: Payer: Self-pay

## 2022-01-05 NOTE — Telephone Encounter (Signed)
Sugars in 150's but patient had stopped Janumet. Recommend he take it as directed. Will have concierge check in again next month ?

## 2022-01-05 NOTE — Progress Notes (Signed)
? ? ?  Chronic Care Management ?Pharmacy Assistant  ? ?Name: Marcus Klein  MRN: [id number] DOB: 01/18/1961 ? ? ?Reason for Encounter: Disease State call for DM  ?  ?Recent office visits:  ?None ? ?Recent consult visits:  ?None ? ?Hospital visits:  ?None ? ?Medications: ?Outpatient Encounter Medications as of 01/05/2022  ?Medication Sig  ? clopidogrel (PLAVIX) 75 MG tablet TAKE 1 TABLET BY MOUTH  DAILY  ? hydrochlorothiazide (HYDRODIURIL) 25 MG tablet TAKE 1 TABLET BY MOUTH  DAILY  ? HYDROcodone-acetaminophen (NORCO) 10-325 MG tablet Take 1 tablet by mouth every 6 (six) hours as needed.  ? JANUMET XR [phone] MG TB24 TAKE 1 TABLET BY MOUTH  DAILY  ? lisinopril (ZESTRIL) 10 MG tablet TAKE 1 TABLET BY MOUTH  DAILY  ? metoprolol succinate (TOPROL-XL) 50 MG 24 hr tablet TAKE 1 TABLET BY MOUTH  TWICE DAILY  ? rosuvastatin (CRESTOR) 20 MG tablet Take 1 tablet (20 mg total) by mouth daily.  ? Semaglutide,0.25 or 0.'5MG'$ /DOS, (OZEMPIC, 0.25 OR 0.5 MG/DOSE,) 2 MG/1.5ML SOPN Inject 0.5 mg into the skin once a week.  ? sildenafil (VIAGRA) 50 MG tablet Take 1 tablet (50 mg total) by mouth daily as needed.  ? ?No facility-administered encounter medications on file as of 01/05/2022.  ? ? ?Recent Relevant Labs: ?Lab Results  ?Component Value Date/Time  ? HGBA1C 7.4 (H) 11/05/2021 08:03 AM  ? HGBA1C 6.8 (H) 07/06/2021 08:20 AM  ? MICROALBUR 80 11/14/2019 08:17 AM  ?  ?Kidney Function ?Lab Results  ?Component Value Date/Time  ? CREATININE 1.01 11/05/2021 08:03 AM  ? CREATININE 1.09 07/06/2021 08:20 AM  ? GFRNONAA 90 10/31/2020 08:00 AM  ? GFRAA 104 10/31/2020 08:00 AM  ? ? ? ?Current antihyperglycemic regimen:  ?Janumet XR 100-'1000mg'$  daily - Pt was told to stop taking when started on Ozempic  ?Ozempic 0.'5mg'$ - 0.5 mg weekly ?Patient verbally confirms he is taking the above medications as directed. Yes ? ?What recent interventions/DTPs have been made to improve glycemic control:  ?Pt was told to stop the Janumet when he starts taking Ozempic.  He started the Ozempic a month ago and feels his sugars are higher. Pt stated he is having some stomach upsets when being on the Ozempic. He stated this happens on the first day of his shot but typically goes away after that. No other issues to note ? ?Have there been any recent hospitalizations or ED visits since last visit with CPP? No ? ?Patient denies hypoglycemic symptoms, including None ? ?Patient denies hyperglycemic symptoms, including none ? ?How often are you checking your blood sugar? once daily ? ?What are your blood sugars ranging? 130s ?Fasting: 150 on 01/04/22 ? ?On insulin? No ? ?During the week, how often does your blood glucose drop below 70? Never ? ?Are you checking your feet daily/regularly? Yes ? ?Adherence Review: ?Is the patient currently on a STATIN medication? Yes ?Is the patient currently on ACE/ARB medication? Yes ?Does the patient have >5 day gap between last estimated fill dates? CPP to review ? ?Care Gaps: ?Last eye exam / Retinopathy Screening? 08/05/20 ?Last Annual Wellness Visit? None noted  ?Last Diabetic Foot Exam? 11/05/21 ?  ? ?Star Rating Drugs:  ?Medication:  Last Fill: Day Supply ?Janumet  (PAP) -Not taking  ?Ozempic (PAP) ?  ?[name], CMA ?Clinical Pharmacist Assistant  ?[phone]  ?

## 2022-01-13 ENCOUNTER — Other Ambulatory Visit: Payer: Self-pay

## 2022-01-13 DIAGNOSIS — Z79891 Long term (current) use of opiate analgesic: Secondary | ICD-10-CM

## 2022-01-13 MED ORDER — HYDROCODONE-ACETAMINOPHEN 10-325 MG PO TABS: 1.0000 | ORAL_TABLET | Freq: Four times a day (QID) | ORAL | 0 refills | Status: DC | PRN

## 2022-01-14 ENCOUNTER — Other Ambulatory Visit: Payer: Self-pay

## 2022-01-14 DIAGNOSIS — Z79891 Long term (current) use of opiate analgesic: Secondary | ICD-10-CM

## 2022-01-14 MED ORDER — HYDROCODONE-ACETAMINOPHEN 10-325 MG PO TABS: 1.0000 | ORAL_TABLET | Freq: Four times a day (QID) | ORAL | 0 refills | Status: DC | PRN

## 2022-01-17 ENCOUNTER — Other Ambulatory Visit: Payer: Self-pay | Admitting: Legal Medicine

## 2022-01-21 ENCOUNTER — Telehealth: Payer: Self-pay

## 2022-01-21 NOTE — Chronic Care Management (AMB) (Signed)
[employer] patient assistance program notification:  Patient is currently enrolled in auto-refill and 120- day supply of Ozempic 0.25/0.5 mg will be filled on 02/13/2022 and  should arrive to the office in 10-14 business days.   [name], [place] Pharmacist Assistant [phone]

## 2022-02-04 ENCOUNTER — Other Ambulatory Visit: Payer: Self-pay | Admitting: Legal Medicine

## 2022-02-04 DIAGNOSIS — N5201 Erectile dysfunction due to arterial insufficiency: Secondary | ICD-10-CM

## 2022-02-11 ENCOUNTER — Other Ambulatory Visit: Payer: Self-pay

## 2022-02-11 DIAGNOSIS — N5201 Erectile dysfunction due to arterial insufficiency: Secondary | ICD-10-CM

## 2022-02-11 MED ORDER — SILDENAFIL CITRATE 50 MG PO TABS: 50.0000 mg | ORAL_TABLET | Freq: Every day | ORAL | 3 refills | Status: DC | PRN

## 2022-02-12 ENCOUNTER — Other Ambulatory Visit: Payer: Self-pay

## 2022-02-12 DIAGNOSIS — N5201 Erectile dysfunction due to arterial insufficiency: Secondary | ICD-10-CM

## 2022-02-12 MED ORDER — SILDENAFIL CITRATE 50 MG PO TABS: 50.0000 mg | ORAL_TABLET | Freq: Every day | ORAL | 3 refills | Status: DC | PRN

## 2022-02-12 NOTE — Telephone Encounter (Signed)
Previously sent to incorrect pharmacy. Resent.   Marcus Klein, [place] 02/12/22 10:29 AM

## 2022-02-16 ENCOUNTER — Other Ambulatory Visit: Payer: Self-pay

## 2022-02-16 DIAGNOSIS — Z79891 Long term (current) use of opiate analgesic: Secondary | ICD-10-CM

## 2022-02-16 MED ORDER — HYDROCODONE-ACETAMINOPHEN 10-325 MG PO TABS: 1.0000 | ORAL_TABLET | Freq: Four times a day (QID) | ORAL | 0 refills | Status: DC | PRN

## 2022-02-17 ENCOUNTER — Telehealth: Payer: Self-pay

## 2022-02-17 ENCOUNTER — Other Ambulatory Visit: Payer: Self-pay

## 2022-02-17 DIAGNOSIS — Z79891 Long term (current) use of opiate analgesic: Secondary | ICD-10-CM

## 2022-02-17 NOTE — Progress Notes (Signed)
    Chronic Care Management Pharmacy Assistant   Name: DEADRIAN TOYA  MRN: [id number] DOB: Aug 04, 1961   Reason for Encounter: Disease State call for DM    Recent office visits:  None  Recent consult visits:  None  Hospital visits:  None  Medications: Outpatient Encounter Medications as of 02/17/2022  Medication Sig   clopidogrel (PLAVIX) 75 MG tablet TAKE 1 TABLET BY MOUTH  DAILY   hydrochlorothiazide (HYDRODIURIL) 25 MG tablet TAKE 1 TABLET BY MOUTH  DAILY   HYDROcodone-acetaminophen (NORCO) 10-325 MG tablet Take 1 tablet by mouth every 6 (six) hours as needed.   JANUMET XR [phone] MG TB24 TAKE 1 TABLET BY MOUTH  DAILY (Patient not taking: Reported on 01/05/2022)   lisinopril (ZESTRIL) 10 MG tablet TAKE 1 TABLET BY MOUTH  DAILY   metoprolol succinate (TOPROL-XL) 50 MG 24 hr tablet TAKE 1 TABLET BY MOUTH  TWICE DAILY   rosuvastatin (CRESTOR) 20 MG tablet TAKE 1 TABLET BY MOUTH DAILY   Semaglutide,0.25 or 0.'5MG'$ /DOS, (OZEMPIC, 0.25 OR 0.5 MG/DOSE,) 2 MG/1.5ML SOPN Inject 0.5 mg into the skin once a week.   sildenafil (VIAGRA) 50 MG tablet Take 1 tablet (50 mg total) by mouth daily as needed.   No facility-administered encounter medications on file as of 02/17/2022.    Recent Relevant Labs: Lab Results  Component Value Date/Time   HGBA1C 7.4 (H) 11/05/2021 08:03 AM   HGBA1C 6.8 (H) 07/06/2021 08:20 AM   MICROALBUR 80 11/14/2019 08:17 AM    Kidney Function Lab Results  Component Value Date/Time   CREATININE 1.01 11/05/2021 08:03 AM   CREATININE 1.09 07/06/2021 08:20 AM   GFRNONAA 90 10/31/2020 08:00 AM   GFRAA 104 10/31/2020 08:00 AM     Current antihyperglycemic regimen:  Ozempic 0.'5mg'$ - 0.5 mg weekly Janumet XR 100-'1000mg'$  daily  Patient verbally confirms he is taking the above medications as directed. Yes  What recent interventions/DTPs have been made to improve glycemic control:  Pt is back on Janumet and stated his sugars have been great  Have there been any  recent hospitalizations or ED visits since last visit with CPP? No  Patient denies hypoglycemic symptoms, including None  Patient denies hyperglycemic symptoms, including none  How often are you checking your blood sugar? once daily  What are your blood sugars ranging? For the last week its been below 120    On insulin? No  During the week, how often does your blood glucose drop below 70? Never  Are you checking your feet daily/regularly? Yes  Adherence Review: Is the patient currently on a STATIN medication? Yes Is the patient currently on ACE/ARB medication? Yes Does the patient have >5 day gap between last estimated fill dates? CPP to review  Care Gaps: Last eye exam / Retinopathy Screening? 08/05/20 Last Annual Wellness Visit? None noted  Last Diabetic Foot Exam? 11/05/21    Star Rating Drugs:  Medication:  Last Fill: Day Supply Ozempic  (PAP) Janumet    11/14/21 -08/16/21 [facility] Gets through mail order [place], [place] Pharmacist Assistant  [phone]

## 2022-02-20 ENCOUNTER — Other Ambulatory Visit: Payer: Self-pay | Admitting: Legal Medicine

## 2022-02-20 DIAGNOSIS — I10 Essential (primary) hypertension: Secondary | ICD-10-CM

## 2022-03-02 ENCOUNTER — Telehealth: Payer: Self-pay

## 2022-03-02 NOTE — Chronic Care Management (AMB) (Signed)
[employer] patient assistance program notification:  120- day supply of Ozempic 0.25/0.5 mg was filled on 02/16/2022 and should arrive to the office in 10-14 business days. Patient has 1  refill remaining and enrollment will expire on 08/05/2022.  The next refill for patient will be fulfilled on 05/05/2022.  [name], [place] Pharmacist Assistant [phone]

## 2022-03-16 ENCOUNTER — Other Ambulatory Visit: Payer: Self-pay

## 2022-03-16 DIAGNOSIS — Z79891 Long term (current) use of opiate analgesic: Secondary | ICD-10-CM

## 2022-03-16 MED ORDER — HYDROCODONE-ACETAMINOPHEN 10-325 MG PO TABS: 1.0000 | ORAL_TABLET | Freq: Four times a day (QID) | ORAL | 0 refills | Status: DC | PRN
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
## 2022-03-23 ENCOUNTER — Ambulatory Visit (INDEPENDENT_AMBULATORY_CARE_PROVIDER_SITE_OTHER): Payer: Medicare Other | Admitting: Nurse Practitioner

## 2022-03-23 ENCOUNTER — Encounter: Payer: Self-pay | Admitting: Nurse Practitioner

## 2022-03-23 ENCOUNTER — Ambulatory Visit: Payer: Medicare Other | Admitting: Legal Medicine

## 2022-03-23 VITALS — BP 142/80 | HR 63 | Temp 97.4°F | Ht 70.0 in | Wt 221.0 lb

## 2022-03-23 DIAGNOSIS — I152 Hypertension secondary to endocrine disorders: Secondary | ICD-10-CM | POA: Diagnosis not present

## 2022-03-23 DIAGNOSIS — I10 Essential (primary) hypertension: Secondary | ICD-10-CM | POA: Diagnosis not present

## 2022-03-23 DIAGNOSIS — Z8673 Personal history of transient ischemic attack (TIA), and cerebral infarction without residual deficits: Secondary | ICD-10-CM | POA: Diagnosis not present

## 2022-03-23 DIAGNOSIS — E782 Mixed hyperlipidemia: Secondary | ICD-10-CM

## 2022-03-23 DIAGNOSIS — I69351 Hemiplegia and hemiparesis following cerebral infarction affecting right dominant side: Secondary | ICD-10-CM | POA: Diagnosis not present

## 2022-03-23 DIAGNOSIS — E1159 Type 2 diabetes mellitus with other circulatory complications: Secondary | ICD-10-CM | POA: Diagnosis not present

## 2022-03-23 DIAGNOSIS — Z6831 Body mass index (BMI) 31.0-31.9, adult: Secondary | ICD-10-CM

## 2022-03-23 DIAGNOSIS — E669 Obesity, unspecified: Secondary | ICD-10-CM

## 2022-03-23 DIAGNOSIS — Z1211 Encounter for screening for malignant neoplasm of colon: Secondary | ICD-10-CM | POA: Diagnosis not present

## 2022-03-23 DIAGNOSIS — E1169 Type 2 diabetes mellitus with other specified complication: Secondary | ICD-10-CM | POA: Diagnosis not present

## 2022-03-23 DIAGNOSIS — Z125 Encounter for screening for malignant neoplasm of prostate: Secondary | ICD-10-CM

## 2022-03-23 NOTE — Patient Instructions (Signed)
We will call you with lab results Continue medications Follow-up in 33-month, fasting Recommend diabetic eye exam   Health Maintenance, Male Adopting a healthy lifestyle and getting preventive care are important in promoting health and wellness. Ask your health care provider about: The right schedule for you to have regular tests and exams. Things you can do on your own to prevent diseases and keep yourself healthy. What should I know about diet, weight, and exercise? Eat a healthy diet  Eat a diet that includes plenty of vegetables, fruits, low-fat dairy products, and lean protein. Do not eat a lot of foods that are high in solid fats, added sugars, or sodium. Maintain a healthy weight Body mass index (BMI) is a measurement that can be used to identify possible weight problems. It estimates body fat based on height and weight. Your health care provider can help determine your BMI and help you achieve or maintain a healthy weight. Get regular exercise Get regular exercise. This is one of the most important things you can do for your health. Most adults should: Exercise for at least 150 minutes each week. The exercise should increase your heart rate and make you sweat (moderate-intensity exercise). Do strengthening exercises at least twice a week. This is in addition to the moderate-intensity exercise. Spend less time sitting. Even light physical activity can be beneficial. Watch cholesterol and blood lipids Have your blood tested for lipids and cholesterol at 61years of age, then have this test every 5 years. You may need to have your cholesterol levels checked more often if: Your lipid or cholesterol levels are high. You are older than 61years of age. You are at high risk for heart disease. What should I know about cancer screening? Many types of cancers can be detected early and may often be prevented. Depending on your health history and family history, you may need to have cancer  screening at various ages. This may include screening for: Colorectal cancer. Prostate cancer. Skin cancer. Lung cancer. What should I know about heart disease, diabetes, and high blood pressure? Blood pressure and heart disease High blood pressure causes heart disease and increases the risk of stroke. This is more likely to develop in people who have high blood pressure readings or are overweight. Talk with your health care provider about your target blood pressure readings. Have your blood pressure checked: Every 3-5 years if you are [age]years of age. Every year if you are 455years old or older. If you are between the ages of 641and 78and are a current or former smoker, ask your health care provider if you should have a one-time screening for abdominal aortic aneurysm (AAA). Diabetes Have regular diabetes screenings. This checks your fasting blood sugar level. Have the screening done: Once every three years after age 989if you are at a normal weight and have a low risk for diabetes. More often and at a younger age if you are overweight or have a high risk for diabetes. What should I know about preventing infection? Hepatitis B If you have a higher risk for hepatitis B, you should be screened for this virus. Talk with your health care provider to find out if you are at risk for hepatitis B infection. Hepatitis C Blood testing is recommended for: Everyone born from [birth year]through 1965. Anyone with known risk factors for hepatitis C. Sexually transmitted infections (STIs) You should be screened each year for STIs, including gonorrhea and chlamydia, if: You are sexually  active and are younger than 61 years of age. You are older than 61 years of age and your health care provider tells you that you are at risk for this type of infection. Your sexual activity has changed since you were last screened, and you are at increased risk for chlamydia or gonorrhea. Ask your health care provider if  you are at risk. Ask your health care provider about whether you are at high risk for HIV. Your health care provider may recommend a prescription medicine to help prevent HIV infection. If you choose to take medicine to prevent HIV, you should first get tested for HIV. You should then be tested every 3 months for as long as you are taking the medicine. Follow these instructions at home: Alcohol use Do not drink alcohol if your health care provider tells you not to drink. If you drink alcohol: Limit how much you have to 0-2 drinks a day. Know how much alcohol is in your drink. In the [place] one drink equals one 12 oz bottle of beer (355 mL), one 5 oz glass of wine (148 mL), or one 1 oz glass of hard liquor (44 mL). Lifestyle Do not use any products that contain nicotine or tobacco. These products include cigarettes, chewing tobacco, and vaping devices, such as e-cigarettes. If you need help quitting, ask your health care provider. Do not use street drugs. Do not share needles. Ask your health care provider for help if you need support or information about quitting drugs. General instructions Schedule regular health, dental, and eye exams. Stay current with your vaccines. Tell your health care provider if: You often feel depressed. You have ever been abused or do not feel safe at home. Summary Adopting a healthy lifestyle and getting preventive care are important in promoting health and wellness. Follow your health care provider's instructions about healthy diet, exercising, and getting tested or screened for diseases. Follow your health care provider's instructions on monitoring your cholesterol and blood pressure. This information is not intended to replace advice given to you by your health care provider. Make sure you discuss any questions you have with your health care provider. Document Revised: 01/12/2021 Document Reviewed: 01/12/2021 Elsevier Patient Education  [place].
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
## 2022-03-24 ENCOUNTER — Telehealth: Payer: Self-pay

## 2022-03-24 NOTE — Chronic Care Management (AMB) (Signed)
    Chronic Care Management Pharmacy Assistant   Name: Marcus Klein  MRN: [id number] DOB: 1960/09/30   Reason for Encounter: Patient Assistance Coordination   Called [employer] Patient assistance program to follow up on [place] refill for patient, patient spoke with provider and concerned, he was getting low and had not received a refill yet from [employer] who supplies medications for [employer]. Spoke with representative she informed me that patient enrollment is active and expires on 09/05/2022. She would have to transfer me to their pharmacy- [facility] [phone]) for status refills.   Spoke with Sharyn Lull from [employer] she was able to verify patient, patient address and provider information, prescription for Janumet XR 100/1000 mg was on file and patient had 3 refills remaining, she will process an urgent shipment and medication will be shipped overnight and arrive to patients home on Friday.   Medications: Outpatient Encounter Medications as of 03/24/2022  Medication Sig   clopidogrel (PLAVIX) 75 MG tablet TAKE 1 TABLET BY MOUTH  DAILY   hydrochlorothiazide (HYDRODIURIL) 25 MG tablet TAKE 1 TABLET BY MOUTH  DAILY   HYDROcodone-acetaminophen (NORCO) 10-325 MG tablet Take 1 tablet by mouth every 6 (six) hours as needed.   JANUMET XR [phone] MG TB24 TAKE 1 TABLET BY MOUTH  DAILY   lisinopril (ZESTRIL) 10 MG tablet TAKE 1 TABLET BY MOUTH  DAILY   metoprolol succinate (TOPROL-XL) 50 MG 24 hr tablet TAKE 1 TABLET BY MOUTH  TWICE DAILY   rosuvastatin (CRESTOR) 20 MG tablet TAKE 1 TABLET BY MOUTH DAILY   Semaglutide,0.25 or 0.'5MG'$ /DOS, (OZEMPIC, 0.25 OR 0.5 MG/DOSE,) 2 MG/1.5ML SOPN Inject 0.5 mg into the skin once a week.   sildenafil (VIAGRA) 50 MG tablet Take 1 tablet (50 mg total) by mouth daily as needed.   No facility-administered encounter medications on file as of 03/24/2022.   [name], [place] Pharmacist Assistant [phone]

## 2022-03-25 LAB — PSA: Prostate Specific Ag, Serum: 0.9 ng/mL (ref 0.0–4.0)

## 2022-03-25 LAB — CBC WITH DIFFERENTIAL/PLATELET
Basophils Absolute: 0.1 10*3/uL (ref 0.0–0.2)
Basos: 1 %
EOS (ABSOLUTE): 0.2 10*3/uL (ref 0.0–0.4)
Eos: 3 %
Hematocrit: 46.1 % (ref 37.5–51.0)
Hemoglobin: 15.5 g/dL (ref 13.0–17.7)
Immature Grans (Abs): 0 10*3/uL (ref 0.0–0.1)
Immature Granulocytes: 0 %
Lymphocytes Absolute: 2.5 10*3/uL (ref 0.7–3.1)
Lymphs: 33 %
MCH: 29.6 pg (ref 26.6–33.0)
MCHC: 33.6 g/dL (ref 31.5–35.7)
MCV: 88 fL (ref 79–97)
Monocytes Absolute: 0.7 10*3/uL (ref 0.1–0.9)
Monocytes: 10 %
Neutrophils Absolute: 4.1 10*3/uL (ref 1.4–7.0)
Neutrophils: 53 %
Platelets: 258 10*3/uL (ref 150–450)
RBC: 5.23 x10E6/uL (ref 4.14–5.80)
RDW: 13.5 % (ref 11.6–15.4)
WBC: 7.6 10*3/uL (ref 3.4–10.8)

## 2022-03-25 LAB — MICROALBUMIN / CREATININE URINE RATIO
Creatinine, Urine: 48.1 mg/dL
Microalb/Creat Ratio: <6 mg/g creat (ref 0–29)
Microalbumin, Urine: <3 ug/mL

## 2022-03-25 LAB — COMPREHENSIVE METABOLIC PANEL
ALT: 21 IU/L (ref 0–44)
AST: 16 IU/L (ref 0–40)
Albumin/Globulin Ratio: 1.7 (ref 1.2–2.2)
Albumin: 4.6 g/dL (ref 3.8–4.9)
Alkaline Phosphatase: 109 IU/L (ref 44–121)
BUN/Creatinine Ratio: 23 (ref 10–24)
BUN: 23 mg/dL (ref 8–27)
Bilirubin Total: 0.4 mg/dL (ref 0.0–1.2)
CO2: 24 mmol/L (ref 20–29)
Calcium: 9.5 mg/dL (ref 8.6–10.2)
Chloride: 97 mmol/L (ref 96–106)
Creatinine, Ser: 1.01 mg/dL (ref 0.76–1.27)
Globulin, Total: 2.7 g/dL (ref 1.5–4.5)
Glucose: 121 mg/dL — ABNORMAL HIGH (ref 70–99)
Potassium: 4.4 mmol/L (ref 3.5–5.2)
Sodium: 136 mmol/L (ref 134–144)
Total Protein: 7.3 g/dL (ref 6.0–8.5)
eGFR: 85 mL/min/{1.73_m2} (ref >59)

## 2022-03-25 LAB — HEMOGLOBIN A1C
Est. average glucose Bld gHb Est-mCnc: 154 mg/dL
Hgb A1c MFr Bld: 7 % — ABNORMAL HIGH (ref 4.8–5.6)

## 2022-03-25 LAB — LIPID PANEL
Chol/HDL Ratio: 3.9 ratio (ref 0.0–5.0)
Cholesterol, Total: 161 mg/dL (ref 100–199)
HDL: 41 mg/dL (ref >39)
LDL Chol Calc (NIH): 88 mg/dL (ref 0–99)
Triglycerides: 185 mg/dL — ABNORMAL HIGH (ref 0–149)
VLDL Cholesterol Cal: 32 mg/dL (ref 5–40)

## 2022-03-25 LAB — CARDIOVASCULAR RISK ASSESSMENT

## 2022-04-01 ENCOUNTER — Telehealth: Payer: Self-pay

## 2022-04-01 NOTE — Progress Notes (Signed)
    Chronic Care Management Pharmacy Assistant   Name: Marcus Klein  MRN: [id number] DOB: [birth year]   Reason for Encounter: Disease State call for DM    Recent office visits:  03/23/22 Marcus Belfast NP. Seen for routine visit. No med changes.  Recent consult visits:  None  Hospital visits:  None  Medications: Outpatient Encounter Medications as of 04/01/2022  Medication Sig   clopidogrel (PLAVIX) 75 MG tablet TAKE 1 TABLET BY MOUTH  DAILY   hydrochlorothiazide (HYDRODIURIL) 25 MG tablet TAKE 1 TABLET BY MOUTH  DAILY   HYDROcodone-acetaminophen (NORCO) 10-325 MG tablet Take 1 tablet by mouth every 6 (six) hours as needed.   JANUMET XR [phone] MG TB24 TAKE 1 TABLET BY MOUTH  DAILY   lisinopril (ZESTRIL) 10 MG tablet TAKE 1 TABLET BY MOUTH  DAILY   metoprolol succinate (TOPROL-XL) 50 MG 24 hr tablet TAKE 1 TABLET BY MOUTH  TWICE DAILY   rosuvastatin (CRESTOR) 20 MG tablet TAKE 1 TABLET BY MOUTH DAILY   Semaglutide,0.25 or 0.'5MG'$ /DOS, (OZEMPIC, 0.25 OR 0.5 MG/DOSE,) 2 MG/1.5ML SOPN Inject 0.5 mg into the skin once a week.   sildenafil (VIAGRA) 50 MG tablet Take 1 tablet (50 mg total) by mouth daily as needed.   No facility-administered encounter medications on file as of 04/01/2022.    Recent Relevant Labs: Lab Results  Component Value Date/Time   HGBA1C 7.0 (H) 03/23/2022 11:25 AM   HGBA1C 7.4 (H) 11/05/2021 08:03 AM   MICROALBUR 80 11/14/2019 08:17 AM    Kidney Function Lab Results  Component Value Date/Time   CREATININE 1.01 03/23/2022 11:25 AM   CREATININE 1.01 11/05/2021 08:03 AM   GFRNONAA 90 10/31/2020 08:00 AM   GFRAA 104 10/31/2020 08:00 AM     Current antihyperglycemic regimen:  Ozempic 0.'5mg'$ - 0.5 mg weekly Janumet XR 100-'1000mg'$  daily    Adherence Review: Is the patient currently on a STATIN medication? Yes Is the patient currently on ACE/ARB medication? Yes Does the patient have >5 day gap between last estimated fill dates? CPP to review  Care  Gaps: Last eye exam / Retinopathy Screening? 08/05/20 Last Annual Wellness Visit? None noted  Last Diabetic Foot Exam? 11/05/21  Star Rating Drugs:  Medication:  Last Fill: Day Supply Ozempic (PAP) Janument (PAP)  Unable to tal to patient to complete this call   Marcus Klein, [place] Pharmacist Assistant  [phone]

## 2022-04-05 ENCOUNTER — Telehealth: Payer: Self-pay

## 2022-04-05 ENCOUNTER — Other Ambulatory Visit: Payer: Self-pay | Admitting: Family Medicine

## 2022-04-05 DIAGNOSIS — N5201 Erectile dysfunction due to arterial insufficiency: Secondary | ICD-10-CM

## 2022-04-05 LAB — COLOGUARD

## 2022-04-05 MED ORDER — SILDENAFIL CITRATE 100 MG PO TABS: ORAL_TABLET | ORAL | 2 refills | Status: DC

## 2022-04-05 NOTE — Telephone Encounter (Signed)
Done. Dr. Kennisha Qin  

## 2022-04-05 NOTE — Telephone Encounter (Signed)
Patient calling requesting to increase dose of sildenafil 50 mg. Feels it does not work as well any more. Please advise. He was seen 7/18 however did not think of this while he was in the office.   [place].  Royce Macadamia, [place] 04/05/22 11:09 AM

## 2022-04-07 ENCOUNTER — Other Ambulatory Visit: Payer: Self-pay

## 2022-04-07 DIAGNOSIS — N5201 Erectile dysfunction due to arterial insufficiency: Secondary | ICD-10-CM

## 2022-04-07 MED ORDER — SILDENAFIL CITRATE 100 MG PO TABS: ORAL_TABLET | ORAL | 2 refills | Status: DC

## 2022-04-10 DIAGNOSIS — Z1211 Encounter for screening for malignant neoplasm of colon: Secondary | ICD-10-CM | POA: Diagnosis not present

## 2022-04-17 LAB — COLOGUARD: COLOGUARD: POSITIVE — AB

## 2022-04-21 ENCOUNTER — Other Ambulatory Visit: Payer: Self-pay

## 2022-04-21 DIAGNOSIS — R195 Other fecal abnormalities: Secondary | ICD-10-CM

## 2022-04-22 ENCOUNTER — Telehealth: Payer: Self-pay

## 2022-04-22 NOTE — Chronic Care Management (AMB) (Signed)
[employer] patient assistance program notification:  120- day supply of Ozempic 0.25/0.5 mg will be filled on 05/13/2022 and should arrive to the office in 10-14 business days.  [name], [place] Pharmacist Assistant [phone]

## 2022-05-11 ENCOUNTER — Telehealth: Payer: Self-pay

## 2022-05-11 NOTE — Patient Outreach (Signed)
  Care Coordination   Initial Visit Note   05/11/2022 Name: Marcus Klein MRN: [id number] DOB: 05/18/1961  Marcus Klein is a 61 y.o. year old male who sees Lillard Anes, MD for primary care. I spoke with  Mikel Cella by phone today.  What matters to the patients health and wellness today?  Placed call to patient today and explained [facility] care coordination program.  Patient reports that he is doing well. Reports that he has all his medications and has his own transportation. Denies any needs. Refused services.     SDOH assessments and interventions completed:  no     Care Coordination Interventions Activated:   no Care Coordination Interventions:  No, not indicated   Follow up plan: No further intervention required.   Encounter Outcome:  Pt. Refused   Tomasa Rand, RN, BSN, CEN [facility] [employer] [phone]

## 2022-05-27 ENCOUNTER — Telehealth: Payer: Self-pay

## 2022-05-27 NOTE — Progress Notes (Signed)
    Chronic Care Management Pharmacy Assistant   Name: ESVIN HNAT  MRN: [id number] DOB: Mar 23, 1961   Reason for Encounter: Disease State call for DM    Recent office visits:  04/05/22 Rochel Brome MD. Orders Only. Increased Viagra to '100mg'$ .   Recent consult visits:  None  Hospital visits:  None  Medications: Outpatient Encounter Medications as of 05/27/2022  Medication Sig   clopidogrel (PLAVIX) 75 MG tablet TAKE 1 TABLET BY MOUTH  DAILY   hydrochlorothiazide (HYDRODIURIL) 25 MG tablet TAKE 1 TABLET BY MOUTH  DAILY   HYDROcodone-acetaminophen (NORCO) 10-325 MG tablet Take 1 tablet by mouth every 6 (six) hours as needed.   JANUMET XR [phone] MG TB24 TAKE 1 TABLET BY MOUTH  DAILY   lisinopril (ZESTRIL) 10 MG tablet TAKE 1 TABLET BY MOUTH  DAILY   metoprolol succinate (TOPROL-XL) 50 MG 24 hr tablet TAKE 1 TABLET BY MOUTH  TWICE DAILY   rosuvastatin (CRESTOR) 20 MG tablet TAKE 1 TABLET BY MOUTH DAILY   Semaglutide,0.25 or 0.'5MG'$ /DOS, (OZEMPIC, 0.25 OR 0.5 MG/DOSE,) 2 MG/1.5ML SOPN Inject 0.5 mg into the skin once a week.   sildenafil (VIAGRA) 100 MG tablet One hour prior to intercourse. No more than one in 24 hours.   No facility-administered encounter medications on file as of 05/27/2022.    Recent Relevant Labs: Lab Results  Component Value Date/Time   HGBA1C 7.0 (H) 03/23/2022 11:25 AM   HGBA1C 7.4 (H) 11/05/2021 08:03 AM   MICROALBUR 80 11/14/2019 08:17 AM    Kidney Function Lab Results  Component Value Date/Time   CREATININE 1.01 03/23/2022 11:25 AM   CREATININE 1.01 11/05/2021 08:03 AM   GFRNONAA 90 10/31/2020 08:00 AM   GFRAA 104 10/31/2020 08:00 AM     Current antihyperglycemic regimen:  Ozempic 0.'5mg'$ - 0.5 mg weekly Janumet XR 100-'1000mg'$  daily Patient verbally confirms he is taking the above medications as directed. Yes  What recent interventions/DTPs have been made to improve glycemic control:  No changes   Have there been any recent hospitalizations  or ED visits since last visit with CPP? No  Patient denies hypoglycemic symptoms, including None  Patient denies hyperglycemic symptoms, including none  How often are you checking your blood sugar? once daily  What are your blood sugars ranging?  Pt stated his sugars run about 125 daily Fasting: 125  On insulin? No  During the week, how often does your blood glucose drop below 70? Never  Are you checking your feet daily/regularly? Yes  Adherence Review: Is the patient currently on a STATIN medication? Yes Is the patient currently on ACE/ARB medication? Yes Does the patient have >5 day gap between last estimated fill dates? CPP to review  Care Gaps: Last eye exam / Retinopathy Screening? 08/05/20 Last Annual Wellness Visit? None noted  Last Diabetic Foot Exam? 11/05/21   Star Rating Drugs:  Medication:  Last Fill: Day Supply Ozempic (PAP)   Approved  Janument (PAP)   [name], [place] Pharmacist Assistant  [phone].

## 2022-05-30 ENCOUNTER — Other Ambulatory Visit: Payer: Self-pay | Admitting: Legal Medicine

## 2022-06-01 ENCOUNTER — Telehealth: Payer: Self-pay

## 2022-06-01 NOTE — Progress Notes (Signed)
Care Gap(s) Not Met that Need to be Addressed:   Colorectal Cancer Screening   Eye Exam for Patients With Diabetes   Action Taken: Update care gaps that I messaged PCP to address DM eye exam at next visit and referral placed to GI due to a positive Cologuard   Follow Up: 06/29/22 with PCP

## 2022-06-03 DIAGNOSIS — E119 Type 2 diabetes mellitus without complications: Secondary | ICD-10-CM | POA: Diagnosis not present

## 2022-06-03 DIAGNOSIS — R195 Other fecal abnormalities: Secondary | ICD-10-CM | POA: Diagnosis not present

## 2022-06-07 ENCOUNTER — Telehealth: Payer: Medicare Other

## 2022-06-08 ENCOUNTER — Telehealth: Payer: Self-pay

## 2022-06-08 NOTE — Chronic Care Management (AMB) (Signed)
[employer] patient assistance program notification:  120- day supply of Ozempic 0.25/0.5 mg was filled on 05/17/2022 and should arrive to the office in 10-14 business days. Patient has 0  refill remaining and enrollment will expire on 08/05/2022.  The next refill for patient will be fulfilled on 08/01/2022.  Filling out reorder form for a refill before end of enrollment year.   [name], [place] Pharmacist Assistant [phone]

## 2022-06-11 ENCOUNTER — Telehealth: Payer: Self-pay

## 2022-06-11 ENCOUNTER — Telehealth: Payer: Medicare Other

## 2022-06-11 NOTE — Telephone Encounter (Signed)
  Care Management   Follow Up Note   06/11/2022 Name: Marcus Klein MRN: [id number] DOB: [birth year]   Referred by: Lillard Anes, MD Reason for referral : Chronic Care Management   An unsuccessful telephone outreach was attempted today. The patient was referred to the case management team for assistance with care management and care coordination.   Follow Up Plan:  Unable to leave VM  [place] Constable, Pharm.D. - [phone]

## 2022-06-16 ENCOUNTER — Telehealth: Payer: Self-pay

## 2022-06-16 NOTE — Progress Notes (Signed)
2024 PAP renewals for Ozempic 0.'5mg'$  and Janumet 100-'1000mg'$  has been started and uploaded to be mailed to pt to complete. Pt has been informed.    [name], [place] Pharmacist Assistant  [phone]

## 2022-06-19 ENCOUNTER — Other Ambulatory Visit: Payer: Self-pay | Admitting: Family Medicine

## 2022-06-19 DIAGNOSIS — N5201 Erectile dysfunction due to arterial insufficiency: Secondary | ICD-10-CM

## 2022-06-28 DIAGNOSIS — G8929 Other chronic pain: Secondary | ICD-10-CM | POA: Insufficient documentation
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
## 2022-06-29 ENCOUNTER — Encounter: Payer: Self-pay | Admitting: Nurse Practitioner

## 2022-06-29 ENCOUNTER — Ambulatory Visit (INDEPENDENT_AMBULATORY_CARE_PROVIDER_SITE_OTHER): Payer: Medicare Other | Admitting: Nurse Practitioner

## 2022-06-29 VITALS — BP 132/68 | HR 56 | Temp 97.1°F | Ht 70.0 in | Wt 222.0 lb

## 2022-06-29 DIAGNOSIS — E1159 Type 2 diabetes mellitus with other circulatory complications: Secondary | ICD-10-CM

## 2022-06-29 DIAGNOSIS — E782 Mixed hyperlipidemia: Secondary | ICD-10-CM

## 2022-06-29 DIAGNOSIS — I69351 Hemiplegia and hemiparesis following cerebral infarction affecting right dominant side: Secondary | ICD-10-CM | POA: Diagnosis not present

## 2022-06-29 DIAGNOSIS — G894 Chronic pain syndrome: Secondary | ICD-10-CM

## 2022-06-29 DIAGNOSIS — I10 Essential (primary) hypertension: Secondary | ICD-10-CM

## 2022-06-29 DIAGNOSIS — G8929 Other chronic pain: Secondary | ICD-10-CM

## 2022-06-29 DIAGNOSIS — J3089 Other allergic rhinitis: Secondary | ICD-10-CM

## 2022-06-29 DIAGNOSIS — Z7901 Long term (current) use of anticoagulants: Secondary | ICD-10-CM

## 2022-06-29 MED ORDER — LEVOCETIRIZINE DIHYDROCHLORIDE 5 MG PO TABS: 5.0000 mg | ORAL_TABLET | Freq: Every evening | ORAL | 1 refills | Status: DC

## 2022-06-29 MED ORDER — FLUTICASONE PROPIONATE 50 MCG/ACT NA SUSP: 2.0000 | Freq: Every day | NASAL | 6 refills | Status: DC

## 2022-06-29 NOTE — Patient Instructions (Addendum)
Take Xyzal 5 mg daily  Use Flonase nasal spray daily Continue medications We will call you with lab results Recommend diabetic eye exam Follow-up 4 months, fasting We will call you for Medicare Annual Wellness visit   Allergic Rhinitis, Adult Allergic rhinitis is a reaction to allergens. Allergens are things that can cause an allergic reaction. This condition affects the lining inside the nose (mucous membrane). There are two types of allergic rhinitis: Seasonal. This type is also called hay fever. It happens only during some times of the year. Perennial. This type can happen at any time of the year. This condition cannot be spread from person to person (is not contagious). It can be mild, worse, or very bad. It can develop at any age and may be outgrown. What are the causes? This condition may be caused by: Pollen from grasses, trees, and weeds. Dust mites. Smoke. Mold. Car fumes. The pee (urine), spit, or dander of pets. Dander is dead skin cells from a pet. What increases the risk? You are more likely to develop this condition if: You have allergies in your family. You have problems like allergies in your family. You may have: Swelling of parts of your eyes and eyelids. Asthma. This affects how you breathe. Long-term redness and swelling on your skin. Food allergies. What are the signs or symptoms? The main symptom of this condition is a runny or stuffy nose (nasal congestion). Other symptoms may include: Sneezing or coughing. Itching and tearing of your eyes. Mucus that drips down the back of your throat (postnasal drip). Trouble sleeping. Feeling tired. Headache. Sore throat. How is this treated? There is no cure for this condition. You should avoid things that you are allergic to. Treatment can help to relieve symptoms. This may include: Medicines that block allergy symptoms, such as corticosteroids or antihistamines. These may be given as a shot, nasal spray, or  pill. Avoiding things you are allergic to. Medicines that give you bits of what you are allergic to over time. This is called immunotherapy. It is done if other treatments do not help. You may get: Shots. Medicine under your tongue. Stronger medicines, if other treatments do not help. Follow these instructions at home: Avoiding allergens Find out what things you are allergic to and avoid them. To do this, try these things: If you get allergies any time of year: Replace carpet with wood, tile, or vinyl flooring. Carpet can trap pet dander and dust. Do not smoke. Do not allow smoking in your home. Change your heating and air conditioning filters at least once a month. If you get allergies only some times of the year: Keep windows closed when you can. Plan things to do outside when pollen counts are lowest. Check pollen counts before you plan things to do outside. When you come indoors, change your clothes and shower before you sit on furniture or bedding. If you are allergic to a pet: Keep the pet out of your bedroom. Vacuum, sweep, and dust often.  General instructions Take over-the-counter and prescription medicines only as told by your doctor. Drink enough fluid to keep your pee (urine) pale yellow. Keep all follow-up visits as told by your doctor. This is important. Where to find more information American Academy of Allergy, Asthma & Immunology: www.aaaai.org Contact a doctor if: You have a fever. You get a cough that does not go away. You make whistling sounds when you breathe (wheeze). Your symptoms slow you down. Your symptoms stop you from doing your  normal things each day. Get help right away if: You are short of breath. This symptom may be an emergency. Do not wait to see if the symptom will go away. Get medical help right away. Call your local emergency services (911 in the [place] Do not drive yourself to the hospital. Summary Allergic rhinitis may be treated by taking  medicines and avoiding things you are allergic to. If you have allergies only some of the year, keep windows closed when you can at those times. Contact your doctor if you get a fever or a cough that does not go away. This information is not intended to replace advice given to you by your health care provider. Make sure you discuss any questions you have with your health care provider. Document Revised: 10/15/2019 Document Reviewed: 08/21/2019 Elsevier Patient Education  [place].

## 2022-06-30 LAB — CBC WITH DIFFERENTIAL/PLATELET
Basophils Absolute: 0.1 10*3/uL (ref 0.0–0.2)
Basos: 1 %
EOS (ABSOLUTE): 0.2 10*3/uL (ref 0.0–0.4)
Eos: 2 %
Hematocrit: 45.5 % (ref 37.5–51.0)
Hemoglobin: 15.6 g/dL (ref 13.0–17.7)
Immature Grans (Abs): 0 10*3/uL (ref 0.0–0.1)
Immature Granulocytes: 0 %
Lymphocytes Absolute: 2.1 10*3/uL (ref 0.7–3.1)
Lymphs: 29 %
MCH: 29.7 pg (ref 26.6–33.0)
MCHC: 34.3 g/dL (ref 31.5–35.7)
MCV: 87 fL (ref 79–97)
Monocytes Absolute: 0.8 10*3/uL (ref 0.1–0.9)
Monocytes: 10 %
Neutrophils Absolute: 4.2 10*3/uL (ref 1.4–7.0)
Neutrophils: 58 %
Platelets: 261 10*3/uL (ref 150–450)
RBC: 5.25 x10E6/uL (ref 4.14–5.80)
RDW: 13.2 % (ref 11.6–15.4)
WBC: 7.3 10*3/uL (ref 3.4–10.8)

## 2022-06-30 LAB — LIPID PANEL
Chol/HDL Ratio: 4.3 ratio (ref 0.0–5.0)
Cholesterol, Total: 164 mg/dL (ref 100–199)
HDL: 38 mg/dL — ABNORMAL LOW (ref >39)
LDL Chol Calc (NIH): 84 mg/dL (ref 0–99)
Triglycerides: 253 mg/dL — ABNORMAL HIGH (ref 0–149)
VLDL Cholesterol Cal: 42 mg/dL — ABNORMAL HIGH (ref 5–40)

## 2022-06-30 LAB — COMPREHENSIVE METABOLIC PANEL
ALT: 30 IU/L (ref 0–44)
AST: 25 IU/L (ref 0–40)
Albumin/Globulin Ratio: 1.5 (ref 1.2–2.2)
Albumin: 4.4 g/dL (ref 3.9–4.9)
Alkaline Phosphatase: 96 IU/L (ref 44–121)
BUN/Creatinine Ratio: 13 (ref 10–24)
BUN: 14 mg/dL (ref 8–27)
Bilirubin Total: 0.4 mg/dL (ref 0.0–1.2)
CO2: 23 mmol/L (ref 20–29)
Calcium: 9.4 mg/dL (ref 8.6–10.2)
Chloride: 97 mmol/L (ref 96–106)
Creatinine, Ser: 1.06 mg/dL (ref 0.76–1.27)
Globulin, Total: 2.9 g/dL (ref 1.5–4.5)
Glucose: 149 mg/dL — ABNORMAL HIGH (ref 70–99)
Potassium: 4.5 mmol/L (ref 3.5–5.2)
Sodium: 138 mmol/L (ref 134–144)
Total Protein: 7.3 g/dL (ref 6.0–8.5)
eGFR: 80 mL/min/{1.73_m2} (ref >59)

## 2022-06-30 LAB — HEMOGLOBIN A1C
Est. average glucose Bld gHb Est-mCnc: 160 mg/dL
Hgb A1c MFr Bld: 7.2 % — ABNORMAL HIGH (ref 4.8–5.6)

## 2022-06-30 LAB — CARDIOVASCULAR RISK ASSESSMENT

## 2022-07-02 ENCOUNTER — Other Ambulatory Visit: Payer: Self-pay

## 2022-07-02 DIAGNOSIS — K635 Polyp of colon: Secondary | ICD-10-CM | POA: Diagnosis not present

## 2022-07-02 DIAGNOSIS — I10 Essential (primary) hypertension: Secondary | ICD-10-CM | POA: Diagnosis not present

## 2022-07-02 DIAGNOSIS — D126 Benign neoplasm of colon, unspecified: Secondary | ICD-10-CM | POA: Diagnosis not present

## 2022-07-02 DIAGNOSIS — E119 Type 2 diabetes mellitus without complications: Secondary | ICD-10-CM | POA: Diagnosis not present

## 2022-07-02 DIAGNOSIS — I252 Old myocardial infarction: Secondary | ICD-10-CM | POA: Diagnosis not present

## 2022-07-02 DIAGNOSIS — Z8673 Personal history of transient ischemic attack (TIA), and cerebral infarction without residual deficits: Secondary | ICD-10-CM | POA: Diagnosis not present

## 2022-07-02 DIAGNOSIS — R195 Other fecal abnormalities: Secondary | ICD-10-CM | POA: Diagnosis not present

## 2022-07-02 DIAGNOSIS — K648 Other hemorrhoids: Secondary | ICD-10-CM | POA: Diagnosis not present

## 2022-07-02 DIAGNOSIS — Z79899 Other long term (current) drug therapy: Secondary | ICD-10-CM | POA: Diagnosis not present

## 2022-07-02 LAB — HM COLONOSCOPY

## 2022-07-02 MED ORDER — SEMAGLUTIDE (1 MG/DOSE) 4 MG/3ML ~~LOC~~ SOPN: 1.0000 mg | PEN_INJECTOR | SUBCUTANEOUS | 0 refills | Status: DC

## 2022-07-07 ENCOUNTER — Encounter: Payer: Self-pay | Admitting: Family Medicine

## 2022-07-08 ENCOUNTER — Other Ambulatory Visit: Payer: Self-pay

## 2022-07-08 DIAGNOSIS — E669 Obesity, unspecified: Secondary | ICD-10-CM

## 2022-07-08 MED ORDER — JANUMET XR 100-1000 MG PO TB24: 1.0000 | ORAL_TABLET | Freq: Every day | ORAL | 2 refills | Status: DC

## 2022-07-20 ENCOUNTER — Other Ambulatory Visit: Payer: Self-pay | Admitting: Family Medicine

## 2022-07-20 DIAGNOSIS — N5201 Erectile dysfunction due to arterial insufficiency: Secondary | ICD-10-CM

## 2022-07-21 ENCOUNTER — Other Ambulatory Visit: Payer: Self-pay | Admitting: Family Medicine

## 2022-07-21 DIAGNOSIS — N5201 Erectile dysfunction due to arterial insufficiency: Secondary | ICD-10-CM

## 2022-07-23 ENCOUNTER — Telehealth: Payer: Self-pay

## 2022-07-23 NOTE — Chronic Care Management (AMB) (Signed)
Mailed 2024 re-enrollment form to [employer] patient assistance program for Janumet XR.  [name], [place] Pharmacist Assistant [phone]

## 2022-08-12 ENCOUNTER — Telehealth: Payer: Self-pay

## 2022-08-12 NOTE — Chronic Care Management (AMB) (Signed)
Faxed 2024 re- enrollment application to [employer] patient assistance for [employer].   [name], [place] Pharmacist Assistant [phone]

## 2022-08-21 ENCOUNTER — Other Ambulatory Visit: Payer: Self-pay | Admitting: Family Medicine

## 2022-08-21 DIAGNOSIS — N5201 Erectile dysfunction due to arterial insufficiency: Secondary | ICD-10-CM

## 2022-08-23 ENCOUNTER — Telehealth: Payer: Self-pay

## 2022-08-23 NOTE — Chronic Care Management (AMB) (Signed)
Mailed Janumet XR application to [employer] patient assistance.    [name], [place] Pharmacist Assistant [phone]

## 2022-09-30 ENCOUNTER — Other Ambulatory Visit: Payer: Self-pay | Admitting: Family Medicine

## 2022-09-30 DIAGNOSIS — N5201 Erectile dysfunction due to arterial insufficiency: Secondary | ICD-10-CM

## 2022-10-06 ENCOUNTER — Other Ambulatory Visit: Payer: Self-pay

## 2022-10-06 MED ORDER — ROSUVASTATIN CALCIUM 20 MG PO TABS: 20.0000 mg | ORAL_TABLET | Freq: Every day | ORAL | 1 refills | Status: DC

## 2022-10-12 NOTE — Assessment & Plan Note (Addendum)
Well controlled.  No changes to medicines. Metoprolol 50 mg twice a day, Lisinopril 10 mg daily, HCTZ 25 mg daily. Continue to work on eating a healthy diet and exercise.  Labs drawn today.
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
## 2022-10-12 NOTE — Assessment & Plan Note (Signed)
Patient is using cane to maintain as much independence as possible. Patient has full ability to perform ADLs. He has chronic pain from accident

## 2022-10-12 NOTE — Assessment & Plan Note (Signed)
The current medical regimen is effective;  continue present plan and medications.  

## 2022-10-12 NOTE — Assessment & Plan Note (Addendum)
Control: Fair Recommend check sugars fasting daily. Recommend check feet daily. Recommend annual eye exams. Medicines: Plan to increase ozempic to 2 mg weekly. Recommend change janumet to metformin xr 1000 mg 2 in am. Continue to work on eating a healthy diet and exercise.  Labs drawn today.

## 2022-10-12 NOTE — Assessment & Plan Note (Signed)
Well controlled.  No changes to medicines. Rosuvastatin 20 mg daily. Continue to work on eating a healthy diet and exercise.  Labs drawn today.  

## 2022-10-13 ENCOUNTER — Encounter: Payer: Self-pay | Admitting: Family Medicine

## 2022-10-13 ENCOUNTER — Ambulatory Visit (INDEPENDENT_AMBULATORY_CARE_PROVIDER_SITE_OTHER): Payer: Medicare Other | Admitting: Family Medicine

## 2022-10-13 VITALS — BP 118/80 | HR 67 | Temp 97.2°F | Ht 70.0 in | Wt 212.0 lb

## 2022-10-13 DIAGNOSIS — I69351 Hemiplegia and hemiparesis following cerebral infarction affecting right dominant side: Secondary | ICD-10-CM

## 2022-10-13 DIAGNOSIS — E1159 Type 2 diabetes mellitus with other circulatory complications: Secondary | ICD-10-CM

## 2022-10-13 DIAGNOSIS — N5201 Erectile dysfunction due to arterial insufficiency: Secondary | ICD-10-CM | POA: Diagnosis not present

## 2022-10-13 DIAGNOSIS — I10 Essential (primary) hypertension: Secondary | ICD-10-CM | POA: Diagnosis not present

## 2022-10-13 DIAGNOSIS — E782 Mixed hyperlipidemia: Secondary | ICD-10-CM

## 2022-10-15 LAB — COMPREHENSIVE METABOLIC PANEL
ALT: 22 IU/L (ref 0–44)
AST: 21 IU/L (ref 0–40)
Albumin/Globulin Ratio: 1.5 (ref 1.2–2.2)
Albumin: 4.6 g/dL (ref 3.9–4.9)
Alkaline Phosphatase: 96 IU/L (ref 44–121)
BUN/Creatinine Ratio: 17 (ref 10–24)
BUN: 18 mg/dL (ref 8–27)
Bilirubin Total: 0.4 mg/dL (ref 0.0–1.2)
CO2: 25 mmol/L (ref 20–29)
Calcium: 9.6 mg/dL (ref 8.6–10.2)
Chloride: 96 mmol/L (ref 96–106)
Creatinine, Ser: 1.03 mg/dL (ref 0.76–1.27)
Globulin, Total: 3.1 g/dL (ref 1.5–4.5)
Glucose: 121 mg/dL — ABNORMAL HIGH (ref 70–99)
Potassium: 3.8 mmol/L (ref 3.5–5.2)
Sodium: 138 mmol/L (ref 134–144)
Total Protein: 7.7 g/dL (ref 6.0–8.5)
eGFR: 83 mL/min/{1.73_m2} (ref >59)

## 2022-10-15 LAB — CBC WITH DIFFERENTIAL/PLATELET
Basophils Absolute: 0.1 10*3/uL (ref 0.0–0.2)
Basos: 1 %
EOS (ABSOLUTE): 0.2 10*3/uL (ref 0.0–0.4)
Eos: 2 %
Hematocrit: 49 % (ref 37.5–51.0)
Hemoglobin: 16.2 g/dL (ref 13.0–17.7)
Immature Grans (Abs): 0 10*3/uL (ref 0.0–0.1)
Immature Granulocytes: 0 %
Lymphocytes Absolute: 2.1 10*3/uL (ref 0.7–3.1)
Lymphs: 29 %
MCH: 29.5 pg (ref 26.6–33.0)
MCHC: 33.1 g/dL (ref 31.5–35.7)
MCV: 89 fL (ref 79–97)
Monocytes Absolute: 0.8 10*3/uL (ref 0.1–0.9)
Monocytes: 10 %
Neutrophils Absolute: 4.3 10*3/uL (ref 1.4–7.0)
Neutrophils: 58 %
Platelets: 296 10*3/uL (ref 150–450)
RBC: 5.5 x10E6/uL (ref 4.14–5.80)
RDW: 13.5 % (ref 11.6–15.4)
WBC: 7.4 10*3/uL (ref 3.4–10.8)

## 2022-10-15 LAB — LIPID PANEL
Chol/HDL Ratio: 3.7 ratio (ref 0.0–5.0)
Cholesterol, Total: 146 mg/dL (ref 100–199)
HDL: 40 mg/dL (ref >39)
LDL Chol Calc (NIH): 82 mg/dL (ref 0–99)
Triglycerides: 137 mg/dL (ref 0–149)
VLDL Cholesterol Cal: 24 mg/dL (ref 5–40)

## 2022-10-15 LAB — MICROALBUMIN / CREATININE URINE RATIO
Creatinine, Urine: 142.8 mg/dL
Microalb/Creat Ratio: 5 mg/g creat (ref 0–29)
Microalbumin, Urine: 6.8 ug/mL

## 2022-10-15 LAB — HEMOGLOBIN A1C
Est. average glucose Bld gHb Est-mCnc: 154 mg/dL
Hgb A1c MFr Bld: 7 % — ABNORMAL HIGH (ref 4.8–5.6)

## 2022-10-15 LAB — CARDIOVASCULAR RISK ASSESSMENT

## 2022-10-17 NOTE — Progress Notes (Signed)
Blood count normal.  Liver function normal.  Kidney function normal.  Cholesterol:great! Much better.  HBA1C: 7.0. good.  Not spilling protein in her urine.

## 2022-10-18 ENCOUNTER — Other Ambulatory Visit: Payer: Self-pay

## 2022-10-21 ENCOUNTER — Telehealth: Payer: Self-pay

## 2022-10-21 NOTE — Progress Notes (Cosign Needed)
Patient approved to receive Ozempic through [employer] patient assistance, enrollment ends 09/06/2023.    [name], [place] Pharmacist Assistant [phone]'

## 2022-10-27 ENCOUNTER — Other Ambulatory Visit: Payer: Self-pay | Admitting: Family Medicine

## 2022-10-27 DIAGNOSIS — N5201 Erectile dysfunction due to arterial insufficiency: Secondary | ICD-10-CM

## 2022-10-28 ENCOUNTER — Telehealth: Payer: Self-pay

## 2022-10-28 NOTE — Telephone Encounter (Signed)
Called patient and told him he needed to call [employer] to get Ozempic delivered

## 2022-10-28 NOTE — Telephone Encounter (Signed)
Patient called and wanted to know if his Ozempic has been ship to our office.  Sent message to Ovid Curd to see if can check on it  Per nathan: He will call patient

## 2022-11-01 LAB — HM DIABETES EYE EXAM

## 2022-11-02 ENCOUNTER — Telehealth: Payer: Self-pay

## 2022-11-02 ENCOUNTER — Encounter: Payer: Self-pay | Admitting: Family Medicine

## 2022-11-02 NOTE — Telephone Encounter (Signed)
Patient picked up assistance medication

## 2022-11-02 NOTE — Telephone Encounter (Signed)
Patient made ware of patient assistance medication is ready for pick up

## 2022-11-11 ENCOUNTER — Telehealth: Payer: Self-pay

## 2022-11-11 NOTE — Telephone Encounter (Signed)
Called patient to schedule Medicare Annual Wellness Visit (AWV). Unable to reach patient.  Last date of AWV: 09/06/09  Please schedule an appointment at any time with Shelle Iron, LPN.  Norton Blizzard, [place] (AAMA)  [place] [facility] [phone]

## 2022-11-11 NOTE — Telephone Encounter (Signed)
Created in error

## 2022-11-16 ENCOUNTER — Other Ambulatory Visit: Payer: Self-pay

## 2022-11-16 DIAGNOSIS — I10 Essential (primary) hypertension: Secondary | ICD-10-CM

## 2022-11-16 MED ORDER — METOPROLOL SUCCINATE ER 50 MG PO TB24: 50.0000 mg | ORAL_TABLET | Freq: Two times a day (BID) | ORAL | 0 refills | Status: DC

## 2022-12-11 ENCOUNTER — Other Ambulatory Visit: Payer: Self-pay | Admitting: Family Medicine

## 2022-12-11 DIAGNOSIS — N5201 Erectile dysfunction due to arterial insufficiency: Secondary | ICD-10-CM

## 2022-12-23 ENCOUNTER — Telehealth: Payer: Self-pay

## 2022-12-23 ENCOUNTER — Ambulatory Visit (INDEPENDENT_AMBULATORY_CARE_PROVIDER_SITE_OTHER): Payer: Medicare Other | Admitting: Family Medicine

## 2022-12-23 VITALS — BP 124/70 | HR 80 | Temp 97.0°F | Resp 16 | Ht 70.0 in | Wt 213.0 lb

## 2022-12-23 DIAGNOSIS — I69351 Hemiplegia and hemiparesis following cerebral infarction affecting right dominant side: Secondary | ICD-10-CM
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
## 2022-12-23 NOTE — Progress Notes (Cosign Needed)
[employer] patient assistance program notification:  120- day supply of Ozempic will be filled on 01/21/2023 and should arrive to the office in 10-14 business days. Patient enrollment will expire on 09/06/2023.    Billee Cashing, CMA Clinical Pharmacist Assistant [phone]

## 2022-12-26 ENCOUNTER — Encounter: Payer: Self-pay | Admitting: Family Medicine

## 2022-12-26 NOTE — Assessment & Plan Note (Signed)
Patient benefits from POW.  Helps to perform ADLs. May benefit from left controls.

## 2022-12-27 ENCOUNTER — Telehealth: Payer: Self-pay

## 2022-12-27 NOTE — Telephone Encounter (Signed)
-----   Message from Blane Ohara, MD sent at 12/26/2022  9:47 PM EDT ----- Regarding: POW [facility] @ [address] [place].  Phone [phone]. Please fax office visit.

## 2022-12-27 NOTE — Telephone Encounter (Signed)
Called Marcus Klein to get fax number, woman stated they don't take insurance they only do private pay and don't need office notes.  Called patient made him aware, stated he will give the office a call back about this, he has a friend that is going to help. Patient stated he will call back.
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
## 2023-01-21 ENCOUNTER — Ambulatory Visit (INDEPENDENT_AMBULATORY_CARE_PROVIDER_SITE_OTHER): Payer: Medicare Other | Admitting: Family Medicine

## 2023-01-21 VITALS — BP 116/70 | HR 78 | Temp 97.6°F | Ht 70.0 in | Wt 216.2 lb

## 2023-01-21 DIAGNOSIS — E782 Mixed hyperlipidemia: Secondary | ICD-10-CM | POA: Diagnosis not present

## 2023-01-21 DIAGNOSIS — E1159 Type 2 diabetes mellitus with other circulatory complications: Secondary | ICD-10-CM | POA: Diagnosis not present

## 2023-01-21 DIAGNOSIS — I10 Essential (primary) hypertension: Secondary | ICD-10-CM

## 2023-01-21 DIAGNOSIS — E669 Obesity, unspecified: Secondary | ICD-10-CM

## 2023-01-21 DIAGNOSIS — I69351 Hemiplegia and hemiparesis following cerebral infarction affecting right dominant side: Secondary | ICD-10-CM | POA: Diagnosis not present

## 2023-01-21 DIAGNOSIS — I152 Hypertension secondary to endocrine disorders: Secondary | ICD-10-CM

## 2023-01-21 DIAGNOSIS — E1169 Type 2 diabetes mellitus with other specified complication: Secondary | ICD-10-CM

## 2023-01-21 DIAGNOSIS — Z6832 Body mass index (BMI) 32.0-32.9, adult: Secondary | ICD-10-CM

## 2023-01-21 MED ORDER — JANUMET XR 100-1000 MG PO TB24
1.0000 | ORAL_TABLET | Freq: Every day | ORAL | 2 refills | Status: DC
Start: 2023-01-21 — End: 2023-01-25

## 2023-01-21 NOTE — Assessment & Plan Note (Signed)
Well controlled.  No changes to medicines. Lisinopril 10 mg daily, and Metoprolol 50 mg twice daily Continue to work on eating a healthy diet and exercise.  Labs drawn today.

## 2023-01-21 NOTE — Assessment & Plan Note (Signed)
Continue to work on eating a healthy diet and exercise.  Labs drawn today.  

## 2023-01-21 NOTE — Assessment & Plan Note (Signed)
Well controlled.  No changes to medicines. Janumet 100-100 mgdaily, Ozempic 1 mg Continue to work on eating a healthy diet and exercise.  Labs drawn today.

## 2023-01-21 NOTE — Assessment & Plan Note (Signed)
Well controlled.  No changes to medicines. Rosuvastatin 20 mg daily Continue to work on eating a healthy diet and exercise.  Labs drawn today.   

## 2023-01-22 LAB — CBC WITH DIFFERENTIAL/PLATELET
Basophils Absolute: 0.1 10*3/uL (ref 0.0–0.2)
Basos: 1 %
EOS (ABSOLUTE): 0.2 10*3/uL (ref 0.0–0.4)
Eos: 2 %
Hematocrit: 47 % (ref 37.5–51.0)
Hemoglobin: 15.5 g/dL (ref 13.0–17.7)
Immature Grans (Abs): 0 10*3/uL (ref 0.0–0.1)
Immature Granulocytes: 0 %
Lymphocytes Absolute: 2.2 10*3/uL (ref 0.7–3.1)
Lymphs: 32 %
MCH: 28.5 pg (ref 26.6–33.0)
MCHC: 33 g/dL (ref 31.5–35.7)
MCV: 87 fL (ref 79–97)
Monocytes Absolute: 0.8 10*3/uL (ref 0.1–0.9)
Monocytes: 12 %
Neutrophils Absolute: 3.6 10*3/uL (ref 1.4–7.0)
Neutrophils: 53 %
Platelets: 277 10*3/uL (ref 150–450)
RBC: 5.43 x10E6/uL (ref 4.14–5.80)
RDW: 13.7 % (ref 11.6–15.4)
WBC: 6.9 10*3/uL (ref 3.4–10.8)

## 2023-01-22 LAB — COMPREHENSIVE METABOLIC PANEL
ALT: 21 IU/L (ref 0–44)
AST: 18 IU/L (ref 0–40)
Albumin/Globulin Ratio: 1.6 (ref 1.2–2.2)
Albumin: 4.4 g/dL (ref 3.9–4.9)
Alkaline Phosphatase: 101 IU/L (ref 44–121)
BUN/Creatinine Ratio: 19 (ref 10–24)
BUN: 20 mg/dL (ref 8–27)
Bilirubin Total: 0.5 mg/dL (ref 0.0–1.2)
CO2: 26 mmol/L (ref 20–29)
Calcium: 9.5 mg/dL (ref 8.6–10.2)
Chloride: 97 mmol/L (ref 96–106)
Creatinine, Ser: 1.04 mg/dL (ref 0.76–1.27)
Globulin, Total: 2.8 g/dL (ref 1.5–4.5)
Glucose: 141 mg/dL — ABNORMAL HIGH (ref 70–99)
Potassium: 4 mmol/L (ref 3.5–5.2)
Sodium: 140 mmol/L (ref 134–144)
Total Protein: 7.2 g/dL (ref 6.0–8.5)
eGFR: 82 mL/min/{1.73_m2} (ref >59)

## 2023-01-22 LAB — TSH: TSH: 1.59 u[IU]/mL (ref 0.450–4.500)

## 2023-01-22 LAB — HEMOGLOBIN A1C
Est. average glucose Bld gHb Est-mCnc: 154 mg/dL
Hgb A1c MFr Bld: 7 % — ABNORMAL HIGH (ref 4.8–5.6)

## 2023-01-22 LAB — CARDIOVASCULAR RISK ASSESSMENT

## 2023-01-22 LAB — LIPID PANEL
Chol/HDL Ratio: 4.2 ratio (ref 0.0–5.0)
Cholesterol, Total: 163 mg/dL (ref 100–199)
HDL: 39 mg/dL — ABNORMAL LOW (ref >39)
LDL Chol Calc (NIH): 89 mg/dL (ref 0–99)
Triglycerides: 205 mg/dL — ABNORMAL HIGH (ref 0–149)
VLDL Cholesterol Cal: 35 mg/dL (ref 5–40)

## 2023-01-24 NOTE — Assessment & Plan Note (Signed)
Control: Fair Recommend check sugars fasting daily. Recommend check feet daily. Recommend annual eye exams. Medicines: ozempic to 1 mg weekly, janumet [phone] mg daily. Continue to work on eating a healthy diet and exercise.  Labs drawn today.

## 2023-01-24 NOTE — Assessment & Plan Note (Signed)
Patient benefits from POW.  Helps to perform ADLs. May benefit from left controls.     

## 2023-01-25 ENCOUNTER — Telehealth: Payer: Self-pay

## 2023-01-25 ENCOUNTER — Other Ambulatory Visit: Payer: Self-pay

## 2023-01-25 DIAGNOSIS — I152 Hypertension secondary to endocrine disorders: Secondary | ICD-10-CM

## 2023-01-25 MED ORDER — JANUMET XR 100-1000 MG PO TB24
1.0000 | ORAL_TABLET | Freq: Every day | ORAL | 2 refills | Status: DC
Start: 2023-01-25 — End: 2023-04-26

## 2023-01-25 MED ORDER — CLOPIDOGREL BISULFATE 75 MG PO TABS: 75.0000 mg | ORAL_TABLET | Freq: Every day | ORAL | 2 refills | Status: DC

## 2023-01-25 NOTE — Telephone Encounter (Signed)
Denise from [facility] [employer] called and stated that the patient tried the place that we originally sent patient to for the Power Wheel chair and they do not work with his insurance. So she is calling requesting that we send the visit notes and order for the power wheel chair to [facility] because patients insurance will work with them to get for patient. Faxing last office visit note for patient to attention of Angelique Blonder at [employer].  Fax# [phone]

## 2023-01-27 ENCOUNTER — Other Ambulatory Visit: Payer: Self-pay

## 2023-01-27 ENCOUNTER — Telehealth: Payer: Self-pay

## 2023-01-27 MED ORDER — HYDROCHLOROTHIAZIDE 25 MG PO TABS: 25.0000 mg | ORAL_TABLET | Freq: Every day | ORAL | 2 refills | Status: DC

## 2023-01-27 MED ORDER — LISINOPRIL 10 MG PO TABS: 10.0000 mg | ORAL_TABLET | Freq: Every day | ORAL | 1 refills | Status: DC

## 2023-01-27 NOTE — Telephone Encounter (Signed)
-----   Message from Daniel Nones sent at 01/27/2023  4:23 PM EDT ----- Regarding: NON [facility] Novo Nordisk patient assistance program notification:  120- day supply of Ozempic  should arrive to the office in 10-14 business days. Patient has 1  refill remaining and enrollment will expire on 09/06/2023. Next refill will be fulfilled on 04/12/2023.   Billee Cashing, CMA Clinical Pharmacist Assistant [phone]

## 2023-01-29 ENCOUNTER — Encounter: Payer: Self-pay | Admitting: Family Medicine

## 2023-02-08 ENCOUNTER — Telehealth: Payer: Self-pay

## 2023-02-08 NOTE — Telephone Encounter (Signed)
Patient notified for patient assistance pick-up.  NDC [id number]. Lot # [id number]. Exp.  07/06/2024.

## 2023-02-09 NOTE — Telephone Encounter (Signed)
Pt picked up patience assistance for ozempic 0.25 (5 pens) blue form signed and left in pharm box

## 2023-03-21 ENCOUNTER — Other Ambulatory Visit: Payer: Self-pay

## 2023-03-21 DIAGNOSIS — J3089 Other allergic rhinitis: Secondary | ICD-10-CM

## 2023-03-21 MED ORDER — LEVOCETIRIZINE DIHYDROCHLORIDE 5 MG PO TABS
5.0000 mg | ORAL_TABLET | Freq: Every evening | ORAL | 1 refills | Status: DC
Start: 2023-03-21 — End: 2024-01-31

## 2023-04-26 ENCOUNTER — Other Ambulatory Visit: Payer: Self-pay | Admitting: Family Medicine

## 2023-04-26 ENCOUNTER — Telehealth: Payer: Self-pay

## 2023-04-26 DIAGNOSIS — I152 Hypertension secondary to endocrine disorders: Secondary | ICD-10-CM

## 2023-04-26 MED ORDER — JANUMET XR 100-1000 MG PO TB24
1.0000 | ORAL_TABLET | Freq: Every day | ORAL | 2 refills | Status: DC
Start: 2023-04-26 — End: 2023-07-25

## 2023-04-26 NOTE — Addendum Note (Signed)
Addended by: Nilda Simmer T on: 04/26/2023 12:28 PM   Modules accepted: Orders

## 2023-04-26 NOTE — Telephone Encounter (Addendum)
Per chart review, it looks like he picked up 5 boxes of Ozempic on 02/09/23 from [facility].  Spoke with patient. He was instructed to increase Ozempic to 1 mg, so he has already completed supply by injecting 2 doses of 0.5 mg. Please complete the below form for Ozempic 1 mg weekly, 4 month supply and fax to [facility]  [email or web address]  Do we have any samples of Ozempic 0.5 mg in the meantime?  For Janumet, the patient assistance company may need a refill. Order pended to send to [facility], please send.

## 2023-04-26 NOTE — Telephone Encounter (Signed)
The patient called this morning to request a refill on the following:  SitaGLIPtin-MetFORMIN HCl (JANUMET XR) [phone] MG TB24- Pt stated that he has 5 more days of this. NOTE: The patient stated that this gets mailed to house. Ozempic (semaglutide)-the pt stated that he is currenty out and has been without this for a week as of this Friday. NOTE: The patient stated that this gets mailed to the office

## 2023-04-26 NOTE — Telephone Encounter (Signed)
Patient informed. 

## 2023-05-13 ENCOUNTER — Telehealth: Payer: Self-pay

## 2023-05-13 NOTE — Telephone Encounter (Signed)
Patient assistance for ozempic arrived. Attempted to reach patient unable to leave message. P.O# [id number]. 5 boxes.

## 2023-05-19 ENCOUNTER — Ambulatory Visit (INDEPENDENT_AMBULATORY_CARE_PROVIDER_SITE_OTHER): Payer: Medicare Other | Admitting: Family Medicine

## 2023-05-19 ENCOUNTER — Encounter: Payer: Self-pay | Admitting: Family Medicine

## 2023-05-19 VITALS — BP 102/64 | HR 72 | Temp 95.4°F | Resp 16 | Ht 70.0 in | Wt 214.0 lb

## 2023-05-19 DIAGNOSIS — Z0001 Encounter for general adult medical examination with abnormal findings: Secondary | ICD-10-CM

## 2023-05-19 DIAGNOSIS — Z0000 Encounter for general adult medical examination without abnormal findings: Secondary | ICD-10-CM

## 2023-05-19 DIAGNOSIS — E1159 Type 2 diabetes mellitus with other circulatory complications: Secondary | ICD-10-CM

## 2023-05-19 DIAGNOSIS — I69351 Hemiplegia and hemiparesis following cerebral infarction affecting right dominant side: Secondary | ICD-10-CM

## 2023-05-19 DIAGNOSIS — N5201 Erectile dysfunction due to arterial insufficiency: Secondary | ICD-10-CM

## 2023-05-19 DIAGNOSIS — E782 Mixed hyperlipidemia: Secondary | ICD-10-CM

## 2023-05-19 DIAGNOSIS — Z125 Encounter for screening for malignant neoplasm of prostate: Secondary | ICD-10-CM

## 2023-05-19 DIAGNOSIS — I10 Essential (primary) hypertension: Secondary | ICD-10-CM | POA: Diagnosis not present

## 2023-05-19 NOTE — Assessment & Plan Note (Signed)
The current medical regimen is effective;  continue present plan and medications.  

## 2023-05-19 NOTE — Assessment & Plan Note (Signed)
Well controlled.  No changes to medicines. Rosuvastatin 20 mg daily Continue to work on eating a healthy diet and exercise.  Labs drawn today.   

## 2023-05-19 NOTE — Assessment & Plan Note (Signed)
Patient benefits from POW.  Helps to perform ADLs. May benefit from left controls.     

## 2023-05-19 NOTE — Progress Notes (Signed)
Duplicate

## 2023-05-19 NOTE — Patient Instructions (Signed)
Health Maintenance, Male Adopting a healthy lifestyle and getting preventive care are important in promoting health and wellness. Ask your health care provider about: The right schedule for you to have regular tests and exams. Things you can do on your own to prevent diseases and keep yourself healthy. What should I know about diet, weight, and exercise? Eat a healthy diet  Eat a diet that includes plenty of vegetables, fruits, low-fat dairy products, and lean protein. Do not eat a lot of foods that are high in solid fats, added sugars, or sodium. Maintain a healthy weight Body mass index (BMI) is a measurement that can be used to identify possible weight problems. It estimates body fat based on height and weight. Your health care provider can help determine your BMI and help you achieve or maintain a healthy weight. Get regular exercise Get regular exercise. This is one of the most important things you can do for your health. Most adults should: Exercise for at least 150 minutes each week. The exercise should increase your heart rate and make you sweat (moderate-intensity exercise). Do strengthening exercises at least twice a week. This is in addition to the moderate-intensity exercise. Spend less time sitting. Even light physical activity can be beneficial. Watch cholesterol and blood lipids Have your blood tested for lipids and cholesterol at 62 years of age, then have this test every 5 years. You may need to have your cholesterol levels checked more often if: Your lipid or cholesterol levels are high. You are older than 62 years of age. You are at high risk for heart disease. What should I know about cancer screening? Many types of cancers can be detected early and may often be prevented. Depending on your health history and family history, you may need to have cancer screening at various ages. This may include screening for: Colorectal cancer. Prostate cancer. Skin cancer. Lung  cancer. What should I know about heart disease, diabetes, and high blood pressure? Blood pressure and heart disease High blood pressure causes heart disease and increases the risk of stroke. This is more likely to develop in people who have high blood pressure readings or are overweight. Talk with your health care provider about your target blood pressure readings. Have your blood pressure checked: Every 3-5 years if you are 18-39 years of age. Every year if you are 40 years old or older. If you are between the ages of 65 and 75 and are a current or former smoker, ask your health care provider if you should have a one-time screening for abdominal aortic aneurysm (AAA). Diabetes Have regular diabetes screenings. This checks your fasting blood sugar level. Have the screening done: Once every three years after age 45 if you are at a normal weight and have a low risk for diabetes. More often and at a younger age if you are overweight or have a high risk for diabetes. What should I know about preventing infection? Hepatitis B If you have a higher risk for hepatitis B, you should be screened for this virus. Talk with your health care provider to find out if you are at risk for hepatitis B infection. Hepatitis C Blood testing is recommended for: Everyone born from 1945 through 1965. Anyone with known risk factors for hepatitis C. Sexually transmitted infections (STIs) You should be screened each year for STIs, including gonorrhea and chlamydia, if: You are sexually active and are younger than 62 years of age. You are older than 62 years of age and your   health care provider tells you that you are at risk for this type of infection. Your sexual activity has changed since you were last screened, and you are at increased risk for chlamydia or gonorrhea. Ask your health care provider if you are at risk. Ask your health care provider about whether you are at high risk for HIV. Your health care provider  may recommend a prescription medicine to help prevent HIV infection. If you choose to take medicine to prevent HIV, you should first get tested for HIV. You should then be tested every 3 months for as long as you are taking the medicine. Follow these instructions at home: Alcohol use Do not drink alcohol if your health care provider tells you not to drink. If you drink alcohol: Limit how much you have to 0-2 drinks a day. Know how much alcohol is in your drink. In the [place] one drink equals one 12 oz bottle of beer (355 mL), one 5 oz glass of wine (148 mL), or one 1 oz glass of hard liquor (44 mL). Lifestyle Do not use any products that contain nicotine or tobacco. These products include cigarettes, chewing tobacco, and vaping devices, such as e-cigarettes. If you need help quitting, ask your health care provider. Do not use street drugs. Do not share needles. Ask your health care provider for help if you need support or information about quitting drugs. General instructions Schedule regular health, dental, and eye exams. Stay current with your vaccines. Tell your health care provider if: You often feel depressed. You have ever been abused or do not feel safe at home. Summary Adopting a healthy lifestyle and getting preventive care are important in promoting health and wellness. Follow your health care provider's instructions about healthy diet, exercising, and getting tested or screened for diseases. Follow your health care provider's instructions on monitoring your cholesterol and blood pressure. This information is not intended to replace advice given to you by your health care provider. Make sure you discuss any questions you have with your health care provider. Document Revised: 01/12/2021 Document Reviewed: 01/12/2021 Elsevier Patient Education  2024 Elsevier Inc.  

## 2023-05-19 NOTE — Assessment & Plan Note (Addendum)
Well controlled.  No changes to medicines. Lisinopril 10 mg daily, and Metoprolol 50 mg twice daily, hydrochlorothiazide 25 mg daily. Continue to work on eating a healthy diet and exercise.  Labs drawn today.
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
## 2023-05-19 NOTE — Assessment & Plan Note (Signed)
Control: Fair Recommend check sugars fasting daily. Recommend check feet daily. Recommend annual eye exams. Medicines: ozempic to 1 mg weekly, janumet [phone] mg daily. Continue to work on eating a healthy diet and exercise.  Labs drawn today.

## 2023-05-20 LAB — LIPID PANEL
Chol/HDL Ratio: 3.9 ratio (ref 0.0–5.0)
Cholesterol, Total: 146 mg/dL (ref 100–199)
HDL: 37 mg/dL — ABNORMAL LOW (ref >39)
LDL Chol Calc (NIH): 74 mg/dL (ref 0–99)
Triglycerides: 213 mg/dL — ABNORMAL HIGH (ref 0–149)
VLDL Cholesterol Cal: 35 mg/dL (ref 5–40)

## 2023-05-20 LAB — CBC WITH DIFFERENTIAL/PLATELET
Basophils Absolute: 0.1 10*3/uL (ref 0.0–0.2)
Basos: 1 %
EOS (ABSOLUTE): 0.2 10*3/uL (ref 0.0–0.4)
Eos: 3 %
Hematocrit: 46.5 % (ref 37.5–51.0)
Hemoglobin: 15.5 g/dL (ref 13.0–17.7)
Immature Grans (Abs): 0 10*3/uL (ref 0.0–0.1)
Immature Granulocytes: 0 %
Lymphocytes Absolute: 1.9 10*3/uL (ref 0.7–3.1)
Lymphs: 28 %
MCH: 29.8 pg (ref 26.6–33.0)
MCHC: 33.3 g/dL (ref 31.5–35.7)
MCV: 89 fL (ref 79–97)
Monocytes Absolute: 0.7 10*3/uL (ref 0.1–0.9)
Monocytes: 10 %
Neutrophils Absolute: 4 10*3/uL (ref 1.4–7.0)
Neutrophils: 58 %
Platelets: 274 10*3/uL (ref 150–450)
RBC: 5.21 x10E6/uL (ref 4.14–5.80)
RDW: 13.9 % (ref 11.6–15.4)
WBC: 6.8 10*3/uL (ref 3.4–10.8)

## 2023-05-20 LAB — COMPREHENSIVE METABOLIC PANEL
ALT: 25 IU/L (ref 0–44)
AST: 21 IU/L (ref 0–40)
Albumin: 4.3 g/dL (ref 3.9–4.9)
Alkaline Phosphatase: 102 IU/L (ref 44–121)
BUN/Creatinine Ratio: 16 (ref 10–24)
BUN: 14 mg/dL (ref 8–27)
Bilirubin Total: 0.5 mg/dL (ref 0.0–1.2)
CO2: 27 mmol/L (ref 20–29)
Calcium: 9.3 mg/dL (ref 8.6–10.2)
Chloride: 97 mmol/L (ref 96–106)
Creatinine, Ser: 0.9 mg/dL (ref 0.76–1.27)
Globulin, Total: 2.7 g/dL (ref 1.5–4.5)
Glucose: 144 mg/dL — ABNORMAL HIGH (ref 70–99)
Potassium: 3.7 mmol/L (ref 3.5–5.2)
Sodium: 138 mmol/L (ref 134–144)
Total Protein: 7 g/dL (ref 6.0–8.5)
eGFR: 97 mL/min/{1.73_m2} (ref >59)

## 2023-05-20 LAB — VITAMIN B12: Vitamin B-12: 266 pg/mL (ref 232–1245)

## 2023-05-20 LAB — PSA: Prostate Specific Ag, Serum: 1 ng/mL (ref 0.0–4.0)

## 2023-05-20 LAB — HEMOGLOBIN A1C
Est. average glucose Bld gHb Est-mCnc: 157 mg/dL
Hgb A1c MFr Bld: 7.1 % — ABNORMAL HIGH (ref 4.8–5.6)

## 2023-05-23 ENCOUNTER — Encounter: Payer: Self-pay | Admitting: Family Medicine

## 2023-05-23 DIAGNOSIS — Z0001 Encounter for general adult medical examination with abnormal findings: Secondary | ICD-10-CM | POA: Insufficient documentation

## 2023-05-23 NOTE — Assessment & Plan Note (Signed)
Education given.  Discussed advanced directives for greater than 15 minutes.

## 2023-05-26 NOTE — Telephone Encounter (Signed)
Pt picked up his patience assitance 5 units of ozempic - signed blue form and left in [place], lpn box

## 2023-06-15 ENCOUNTER — Other Ambulatory Visit: Payer: Self-pay | Admitting: Family Medicine

## 2023-06-15 DIAGNOSIS — I10 Essential (primary) hypertension: Secondary | ICD-10-CM

## 2023-06-16 ENCOUNTER — Other Ambulatory Visit: Payer: Self-pay | Admitting: Family Medicine

## 2023-07-25 ENCOUNTER — Other Ambulatory Visit: Payer: Self-pay

## 2023-07-25 DIAGNOSIS — I152 Hypertension secondary to endocrine disorders: Secondary | ICD-10-CM

## 2023-07-25 MED ORDER — SEMAGLUTIDE (1 MG/DOSE) 4 MG/3ML ~~LOC~~ SOPN: 1.0000 mg | PEN_INJECTOR | SUBCUTANEOUS | 2 refills | Status: DC

## 2023-07-25 MED ORDER — JANUMET XR 100-1000 MG PO TB24
1.0000 | ORAL_TABLET | Freq: Every day | ORAL | 2 refills | Status: DC
Start: 2023-07-25 — End: 2024-02-10

## 2023-07-28 ENCOUNTER — Other Ambulatory Visit: Payer: Self-pay | Admitting: Family Medicine

## 2023-08-03 ENCOUNTER — Telehealth: Payer: Self-pay

## 2023-08-03 NOTE — Telephone Encounter (Signed)
PAP: Application for Ozempic has been submitted to PAP Companies: [employer], online  Please note the prescriber portion has been faxed to Dr. Fritzi Mandes Cox's office.  PAP application for (Janumet [employer]))  has been mailed to pt home. I have faxed Dr. Blane Ohara the providers page for review.

## 2023-08-17 NOTE — Telephone Encounter (Signed)
PAP: Application for Janumet has been submitted to PAP Companies: [employer], via Health visitor

## 2023-08-18 NOTE — Telephone Encounter (Signed)
Received notification from [employer] to verify patient's insurance.  Faxed completed insurance verification form and copy of insurance card.

## 2023-08-23 NOTE — Telephone Encounter (Signed)
PAP: Patient assistance application for Ozempic has been approved by PAP Companies: [employer] from 09/07/2023 to 09/05/2024. Medication should be delivered to PAP Delivery: Provider's office For further shipping updates, please contact [employer] at [phone] Pt ID is: [id number]

## 2023-08-24 NOTE — Progress Notes (Signed)
Cancelled.  

## 2023-08-25 ENCOUNTER — Other Ambulatory Visit: Payer: Self-pay

## 2023-08-25 NOTE — Telephone Encounter (Signed)
Patient is calling needing Sample of Ozempic, he is currently taking 1 mg weekly.  Patient made aware, Sample will be put up with his name on it.

## 2023-08-26 ENCOUNTER — Other Ambulatory Visit: Payer: Self-pay | Admitting: Family Medicine

## 2023-08-26 ENCOUNTER — Encounter (INDEPENDENT_AMBULATORY_CARE_PROVIDER_SITE_OTHER): Payer: Medicare Other | Admitting: Family Medicine

## 2023-08-26 DIAGNOSIS — I10 Essential (primary) hypertension: Secondary | ICD-10-CM

## 2023-08-26 DIAGNOSIS — E1159 Type 2 diabetes mellitus with other circulatory complications: Secondary | ICD-10-CM

## 2023-08-26 DIAGNOSIS — N5201 Erectile dysfunction due to arterial insufficiency: Secondary | ICD-10-CM

## 2023-08-26 DIAGNOSIS — I69351 Hemiplegia and hemiparesis following cerebral infarction affecting right dominant side: Secondary | ICD-10-CM

## 2023-08-26 DIAGNOSIS — E782 Mixed hyperlipidemia: Secondary | ICD-10-CM
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
## 2023-09-02 ENCOUNTER — Encounter: Payer: Self-pay | Admitting: Physician Assistant

## 2023-09-02 ENCOUNTER — Ambulatory Visit (INDEPENDENT_AMBULATORY_CARE_PROVIDER_SITE_OTHER): Payer: Medicare Other | Admitting: Physician Assistant

## 2023-09-02 VITALS — BP 122/80 | HR 61 | Temp 96.2°F | Resp 14 | Ht 70.0 in | Wt 207.0 lb

## 2023-09-02 DIAGNOSIS — E782 Mixed hyperlipidemia: Secondary | ICD-10-CM | POA: Diagnosis not present

## 2023-09-02 DIAGNOSIS — I10 Essential (primary) hypertension: Secondary | ICD-10-CM

## 2023-09-02 DIAGNOSIS — E1159 Type 2 diabetes mellitus with other circulatory complications: Secondary | ICD-10-CM

## 2023-09-02 DIAGNOSIS — I69351 Hemiplegia and hemiparesis following cerebral infarction affecting right dominant side: Secondary | ICD-10-CM

## 2023-09-02 NOTE — Assessment & Plan Note (Signed)
Well controlled.  Continue to work on eating a healthy diet and exercise.  Labs drawn today.   No major side effects reported, and no issues with compliance. The current medical regimen is effective;  continue present plan with Metoprolol 50mg . Hydrochlorothiazide 25mg , Lisinopril 10mg  Will adjust medication as needed depending on labs BP Readings from Last 3 Encounters:  09/02/23 122/80  05/19/23 102/64  01/21/23 116/70

## 2023-09-02 NOTE — Assessment & Plan Note (Signed)
Well controlled.  Continue to work on eating a healthy diet and exercise.  Labs drawn today.   No major side effects reported, and no issues with compliance. The current medical regimen is effective;  continue present plan with Crestor 20mg  Will adjust medication as needed depending on labs Lab Results  Component Value Date   LDLCALC 74 05/19/2023

## 2023-09-02 NOTE — Assessment & Plan Note (Signed)
Chronic right-sided weakness and sensory changes. No new neurological symptoms. -No changes to current management.

## 2023-09-02 NOTE — Assessment & Plan Note (Signed)
A1c around 7.1. Patient reports positive experience with Ozempic (1mg ) and interest in reducing Metformin due to gastrointestinal side effects. -Continue Ozempic 1mg , monitor A1c. -Consider reducing Metformin if A1c remains stable. -Order labs today to monitor A1c and other relevant parameters.

## 2023-09-03 LAB — COMPREHENSIVE METABOLIC PANEL
ALT: 23 [IU]/L (ref 0–44)
AST: 20 [IU]/L (ref 0–40)
Albumin: 4.6 g/dL (ref 3.9–4.9)
Alkaline Phosphatase: 104 [IU]/L (ref 44–121)
BUN/Creatinine Ratio: 25 — ABNORMAL HIGH (ref 10–24)
BUN: 24 mg/dL (ref 8–27)
Bilirubin Total: 0.5 mg/dL (ref 0.0–1.2)
CO2: 25 mmol/L (ref 20–29)
Calcium: 9.9 mg/dL (ref 8.6–10.2)
Chloride: 97 mmol/L (ref 96–106)
Creatinine, Ser: 0.96 mg/dL (ref 0.76–1.27)
Globulin, Total: 3 g/dL (ref 1.5–4.5)
Glucose: 119 mg/dL — ABNORMAL HIGH (ref 70–99)
Potassium: 4 mmol/L (ref 3.5–5.2)
Sodium: 140 mmol/L (ref 134–144)
Total Protein: 7.6 g/dL (ref 6.0–8.5)
eGFR: 89 mL/min/{1.73_m2} (ref >59)

## 2023-09-03 LAB — CBC WITH DIFFERENTIAL/PLATELET
Basophils Absolute: 0.1 10*3/uL (ref 0.0–0.2)
Basos: 1 %
EOS (ABSOLUTE): 0.2 10*3/uL (ref 0.0–0.4)
Eos: 2 %
Hematocrit: 49.5 % (ref 37.5–51.0)
Hemoglobin: 16.8 g/dL (ref 13.0–17.7)
Immature Grans (Abs): 0 10*3/uL (ref 0.0–0.1)
Immature Granulocytes: 0 %
Lymphocytes Absolute: 2.1 10*3/uL (ref 0.7–3.1)
Lymphs: 25 %
MCH: 29.9 pg (ref 26.6–33.0)
MCHC: 33.9 g/dL (ref 31.5–35.7)
MCV: 88 fL (ref 79–97)
Monocytes Absolute: 1 10*3/uL — ABNORMAL HIGH (ref 0.1–0.9)
Monocytes: 11 %
Neutrophils Absolute: 5.2 10*3/uL (ref 1.4–7.0)
Neutrophils: 61 %
Platelets: 299 10*3/uL (ref 150–450)
RBC: 5.61 x10E6/uL (ref 4.14–5.80)
RDW: 13.3 % (ref 11.6–15.4)
WBC: 8.5 10*3/uL (ref 3.4–10.8)

## 2023-09-03 LAB — LIPID PANEL
Chol/HDL Ratio: 4.2 {ratio} (ref 0.0–5.0)
Cholesterol, Total: 167 mg/dL (ref 100–199)
HDL: 40 mg/dL (ref >39)
LDL Chol Calc (NIH): 99 mg/dL (ref 0–99)
Triglycerides: 161 mg/dL — ABNORMAL HIGH (ref 0–149)
VLDL Cholesterol Cal: 28 mg/dL (ref 5–40)

## 2023-09-03 LAB — HEMOGLOBIN A1C
Est. average glucose Bld gHb Est-mCnc: 148 mg/dL
Hgb A1c MFr Bld: 6.8 % — ABNORMAL HIGH (ref 4.8–5.6)

## 2023-09-27 ENCOUNTER — Telehealth: Payer: Self-pay

## 2023-09-27 NOTE — Telephone Encounter (Signed)
Called patient to reschedule appointment due to provider being out the week of 12/05/23. Unable to leave vociemail

## 2023-10-04 ENCOUNTER — Telehealth: Payer: Self-pay

## 2023-10-04 NOTE — Telephone Encounter (Signed)
Patient informed PAP ozempic 4 boxes are here for pick up P O number [id number]

## 2023-10-05 ENCOUNTER — Encounter: Payer: Self-pay | Admitting: Physician Assistant

## 2023-10-05 ENCOUNTER — Ambulatory Visit (INDEPENDENT_AMBULATORY_CARE_PROVIDER_SITE_OTHER): Payer: Medicare Other | Admitting: Physician Assistant

## 2023-10-05 VITALS — BP 102/62 | HR 79 | Temp 97.6°F | Resp 16 | Ht 70.0 in | Wt 206.4 lb

## 2023-10-05 DIAGNOSIS — E782 Mixed hyperlipidemia: Secondary | ICD-10-CM

## 2023-10-05 DIAGNOSIS — G8929 Other chronic pain: Secondary | ICD-10-CM | POA: Insufficient documentation

## 2023-10-05 DIAGNOSIS — E1159 Type 2 diabetes mellitus with other circulatory complications: Secondary | ICD-10-CM | POA: Diagnosis not present

## 2023-10-05 DIAGNOSIS — M179 Osteoarthritis of knee, unspecified: Secondary | ICD-10-CM | POA: Diagnosis not present

## 2023-10-05 DIAGNOSIS — M25512 Pain in left shoulder: Secondary | ICD-10-CM | POA: Diagnosis not present

## 2023-10-05 MED ORDER — OZEMPIC (2 MG/DOSE) 8 MG/3ML ~~LOC~~ SOPN: 2.0000 mg | PEN_INJECTOR | SUBCUTANEOUS | 1 refills | Status: AC

## 2023-10-05 MED ORDER — DICLOFENAC SODIUM 1 % EX GEL: 4.0000 g | Freq: Four times a day (QID) | CUTANEOUS | 3 refills | Status: AC
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
## 2023-10-05 NOTE — Telephone Encounter (Signed)
Patient picked up medicine

## 2023-10-05 NOTE — Assessment & Plan Note (Signed)
Persistent despite current treatment with rosuvastatin 20mg . Patient is reluctant to increase statin dose due to potential side effects. -Continue current dose of rosuvastatin 20mg . -Optimize diabetes control with semaglutide to potentially improve lipid profile.

## 2023-10-05 NOTE — Assessment & Plan Note (Signed)
Chronic shoulder pain, likely due to rotator cuff and bicep tendon inflammation. Previous surgeries and injections have not provided lasting relief. Pain is exacerbated by physical activity and is limiting daily activities. -Administered shoulder injection today. -Prescribe Voltaren gel for topical application twice daily. -Consider referral for physical therapy and dry needling if pain persists after injection and use of Voltaren gel.

## 2023-10-05 NOTE — Assessment & Plan Note (Signed)
On semaglutide 1mg  for the past six months with no side effects and improving glycemic control. -Increase semaglutide to 2mg . -Monitor for potential side effects, particularly nausea. If side effects occur, patient can reduce dose to 1mg  or 1.5mg . -Continue monitoring A1C to assess response to increased dose of semaglutide.

## 2023-10-05 NOTE — Assessment & Plan Note (Signed)
Use Voltaren gel as needed for knee pain Continue to monitor symptoms Can attempt injection if pain persits

## 2023-10-07 NOTE — Telephone Encounter (Signed)
PAP: Patient assistance application for Janumet XR has been approved by PAP Companies: [employer] from 10/04/2023 to 09/05/2024. Medication should be delivered to PAP Delivery: Home. For further shipping updates, please contact [employer] at [phone]. Patient ID is: no id per Alecia Lemming

## 2023-10-07 NOTE — Progress Notes (Signed)
Pharmacy Medication Assistance Program Note    10/07/2023  Patient ID: Marcus Klein, male  DOB: [birth year], 63 y.o.  MRN:  [id number]     08/03/2023 08/17/2023  Outreach Medication Two  Initial Outreach Date (Medication Two) 08/03/2023   Manufacturer Medication Two [employer]  [employer] [facility] Janumet   Dose of Janumet 10-1000mg    [facility]  Ozempic  Dose of Ozempic error 1mg /week  Type of Chief Technology Officer Items Requested Application;Proof of Income   Date Application Sent to Prescriber 08/03/2023 08/11/2023  Name of Prescriber kirsten cox Kirsten Cox  Date Application Received From Patient 08/17/2023   Application Items Received From Patient Application   Date Application Received From Provider 08/11/2023 08/11/2023  Method Application Sent to Manufacturer Mailed Fax  Date Application Submitted to Manufacturer 08/17/2023 08/11/2023  Patient Assistance Determination Approved Approved  Approval Start Date 10/04/2023 09/07/2023  Patient Notification Method Telephone Call Telephone Call  Telephone Call Outcome Successful Successful     Signature

## 2023-11-04 ENCOUNTER — Other Ambulatory Visit: Payer: Self-pay | Admitting: Family Medicine

## 2023-12-07 ENCOUNTER — Ambulatory Visit: Payer: Medicare Other | Admitting: Family Medicine

## 2024-01-06 ENCOUNTER — Telehealth: Payer: Self-pay

## 2024-01-06 NOTE — Telephone Encounter (Signed)
 Called patient and let him know that we received his patient assistant and ozempic  1mg  is ready for pick up. Patient is coming in Monday for pick up.   Ozempic  1 mg ( 4 boxes)

## 2024-01-09 NOTE — Telephone Encounter (Signed)
 PATIENT PICKED UP PATIENT ASSISTANCE.

## 2024-01-31 ENCOUNTER — Other Ambulatory Visit: Payer: Self-pay | Admitting: Family Medicine

## 2024-01-31 DIAGNOSIS — J3089 Other allergic rhinitis: Secondary | ICD-10-CM

## 2024-02-04 ENCOUNTER — Other Ambulatory Visit: Payer: Self-pay | Admitting: Family Medicine

## 2024-02-04 DIAGNOSIS — I10 Essential (primary) hypertension: Secondary | ICD-10-CM

## 2024-02-10 ENCOUNTER — Other Ambulatory Visit: Payer: Self-pay

## 2024-02-10 DIAGNOSIS — E669 Obesity, unspecified: Secondary | ICD-10-CM

## 2024-02-10 MED ORDER — JANUMET XR 100-1000 MG PO TB24: 1.0000 | ORAL_TABLET | Freq: Every day | ORAL | 2 refills | Status: DC
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
## 2024-03-02 ENCOUNTER — Ambulatory Visit (INDEPENDENT_AMBULATORY_CARE_PROVIDER_SITE_OTHER): Admitting: Family Medicine

## 2024-03-02 VITALS — BP 130/78 | HR 65 | Temp 98.2°F | Ht 70.0 in | Wt 208.0 lb

## 2024-03-02 DIAGNOSIS — R2689 Other abnormalities of gait and mobility: Secondary | ICD-10-CM

## 2024-03-02 DIAGNOSIS — G8929 Other chronic pain: Secondary | ICD-10-CM

## 2024-03-02 DIAGNOSIS — I69351 Hemiplegia and hemiparesis following cerebral infarction affecting right dominant side: Secondary | ICD-10-CM

## 2024-03-02 DIAGNOSIS — I10 Essential (primary) hypertension: Secondary | ICD-10-CM

## 2024-03-02 DIAGNOSIS — E782 Mixed hyperlipidemia: Secondary | ICD-10-CM

## 2024-03-02 DIAGNOSIS — E1159 Type 2 diabetes mellitus with other circulatory complications: Secondary | ICD-10-CM | POA: Diagnosis not present

## 2024-03-02 DIAGNOSIS — M25512 Pain in left shoulder: Secondary | ICD-10-CM

## 2024-03-02 DIAGNOSIS — R509 Fever, unspecified: Secondary | ICD-10-CM

## 2024-03-02 NOTE — Patient Instructions (Signed)
 VISIT SUMMARY:  You visited us  today due to a recent episode of sudden loss of balance and fever, which resolved on its own. We also reviewed your diabetes, hypertension, and cholesterol management, and discussed some general health maintenance items.  YOUR PLAN:  ACUTE EPISODE OF IMMOBILITY AND FEVER: You experienced a sudden inability to move and fever three weeks ago, which resolved the next day. -If these symptoms happen again, please seek emergency care immediately.  TYPE 2 DIABETES MELLITUS: You are currently taking Janumet  and Ozempic  for diabetes management. -Verify your Ozempic  dosage and let us  know. -Continue taking Janumet  and Ozempic  as prescribed.  HYPERTENSION: Your blood pressure is well-controlled with your current medications. -Continue taking lisinopril , metoprolol , and hydrochlorothiazide  as prescribed.  HYPERLIPIDEMIA: You are taking rosuvastatin  to manage your cholesterol levels. -Continue taking rosuvastatin  as prescribed.  GENERAL HEALTH MAINTENANCE: We discussed some general health maintenance items. -Perform a urine test for proteinuria. -Schedule an eye exam.  FOLLOW-UP: You prefer appointment reminders via phone call. -We will schedule a follow-up appointment in the fall. -We will ensure that you receive appointment reminders via phone call.

## 2024-03-03 LAB — CBC WITH DIFFERENTIAL/PLATELET
Basophils Absolute: 0.1 10*3/uL (ref 0.0–0.2)
Basos: 1 %
EOS (ABSOLUTE): 0.1 10*3/uL (ref 0.0–0.4)
Eos: 1 %
Hematocrit: 48 % (ref 37.5–51.0)
Hemoglobin: 16 g/dL (ref 13.0–17.7)
Immature Grans (Abs): 0 10*3/uL (ref 0.0–0.1)
Immature Granulocytes: 0 %
Lymphocytes Absolute: 5.1 10*3/uL — ABNORMAL HIGH (ref 0.7–3.1)
Lymphs: 55 %
MCH: 30.4 pg (ref 26.6–33.0)
MCHC: 33.3 g/dL (ref 31.5–35.7)
MCV: 91 fL (ref 79–97)
Monocytes Absolute: 0.7 10*3/uL (ref 0.1–0.9)
Monocytes: 8 %
Neutrophils Absolute: 3.2 10*3/uL (ref 1.4–7.0)
Neutrophils: 35 %
Platelets: 276 10*3/uL (ref 150–450)
RBC: 5.26 x10E6/uL (ref 4.14–5.80)
RDW: 13.4 % (ref 11.6–15.4)
WBC: 9.2 10*3/uL (ref 3.4–10.8)

## 2024-03-03 LAB — LIPID PANEL
Chol/HDL Ratio: 4.6 ratio (ref 0.0–5.0)
Cholesterol, Total: 164 mg/dL (ref 100–199)
HDL: 36 mg/dL — ABNORMAL LOW (ref >39)
LDL Chol Calc (NIH): 92 mg/dL (ref 0–99)
Triglycerides: 210 mg/dL — ABNORMAL HIGH (ref 0–149)
VLDL Cholesterol Cal: 36 mg/dL (ref 5–40)

## 2024-03-03 LAB — MICROALBUMIN / CREATININE URINE RATIO
Creatinine, Urine: 32.5 mg/dL
Microalb/Creat Ratio: <9 mg/g{creat} (ref 0–29)
Microalbumin, Urine: <3 ug/mL

## 2024-03-03 LAB — HEMOGLOBIN A1C
Est. average glucose Bld gHb Est-mCnc: 140 mg/dL
Hgb A1c MFr Bld: 6.5 % — ABNORMAL HIGH (ref 4.8–5.6)

## 2024-03-03 LAB — COMPREHENSIVE METABOLIC PANEL WITH GFR
ALT: 24 IU/L (ref 0–44)
AST: 22 IU/L (ref 0–40)
Albumin: 4.4 g/dL (ref 3.9–4.9)
Alkaline Phosphatase: 94 IU/L (ref 44–121)
BUN/Creatinine Ratio: 14 (ref 10–24)
BUN: 13 mg/dL (ref 8–27)
Bilirubin Total: 0.6 mg/dL (ref 0.0–1.2)
CO2: 20 mmol/L (ref 20–29)
Calcium: 9.8 mg/dL (ref 8.6–10.2)
Chloride: 98 mmol/L (ref 96–106)
Creatinine, Ser: 0.92 mg/dL (ref 0.76–1.27)
Globulin, Total: 2.8 g/dL (ref 1.5–4.5)
Glucose: 124 mg/dL — ABNORMAL HIGH (ref 70–99)
Potassium: 4.1 mmol/L (ref 3.5–5.2)
Sodium: 136 mmol/L (ref 134–144)
Total Protein: 7.2 g/dL (ref 6.0–8.5)
eGFR: 94 mL/min/{1.73_m2} (ref >59)

## 2024-03-04 ENCOUNTER — Ambulatory Visit: Payer: Self-pay | Admitting: Family Medicine

## 2024-03-04 ENCOUNTER — Encounter: Payer: Self-pay | Admitting: Family Medicine

## 2024-03-04 DIAGNOSIS — R2689 Other abnormalities of gait and mobility: Secondary | ICD-10-CM | POA: Insufficient documentation

## 2024-03-04 DIAGNOSIS — R509 Fever, unspecified: Secondary | ICD-10-CM | POA: Insufficient documentation

## 2024-03-04 NOTE — Assessment & Plan Note (Signed)
 Stable If these symptoms happen again, please seek emergency care immediately.

## 2024-03-04 NOTE — Assessment & Plan Note (Signed)
 Taking rosuvastatin  to manage your cholesterol levels. -Continue taking rosuvastatin  as prescribed.

## 2024-03-04 NOTE — Assessment & Plan Note (Signed)
 Chronic right-sided weakness and sensory changes. No new neurological symptoms. -No changes to current management.

## 2024-03-04 NOTE — Assessment & Plan Note (Signed)
 Blood pressure is well-controlled with your current medications. -Continue taking lisinopril , metoprolol , and hydrochlorothiazide  as prescribed.

## 2024-03-04 NOTE — Assessment & Plan Note (Signed)
 Resolved the next day. If these symptoms happen again, please seek emergency care immediately.

## 2024-03-04 NOTE — Assessment & Plan Note (Signed)
Referring to physical therapy.

## 2024-03-04 NOTE — Assessment & Plan Note (Signed)
 Diabetes well controlled Currently taking Janumet  and Ozempic  for diabetes management. -Verify your Ozempic  dosage and let us  know. -Continue taking Janumet  and Ozempic  as prescribed.

## 2024-03-05 MED ORDER — FENOFIBRATE 160 MG PO TABS: 160.0000 mg | ORAL_TABLET | Freq: Every day | ORAL | 0 refills | Status: DC

## 2024-04-05 ENCOUNTER — Other Ambulatory Visit: Payer: Self-pay

## 2024-04-18 ENCOUNTER — Telehealth: Payer: Self-pay

## 2024-04-18 NOTE — Telephone Encounter (Signed)
 Patient was informed that we received patient assistance for ozempic . He will come by the office next day.

## 2024-04-20 NOTE — Telephone Encounter (Signed)
 Patient picked up patient assistance

## 2024-05-05 ENCOUNTER — Other Ambulatory Visit: Payer: Self-pay | Admitting: Physician Assistant

## 2024-05-06 ENCOUNTER — Other Ambulatory Visit: Payer: Self-pay | Admitting: Family Medicine

## 2024-05-10 ENCOUNTER — Other Ambulatory Visit: Payer: Self-pay | Admitting: Family Medicine

## 2024-05-10 DIAGNOSIS — I152 Hypertension secondary to endocrine disorders: Secondary | ICD-10-CM

## 2024-05-10 MED ORDER — JANUMET XR 100-1000 MG PO TB24: 1.0000 | ORAL_TABLET | Freq: Every day | ORAL | 2 refills | Status: DC

## 2024-05-10 NOTE — Telephone Encounter (Signed)
 Copied from CRM [id number]. Topic: Clinical - Medication Refill >> May 10, 2024 12:51 PM Emylou G wrote: Medication: SitaGLIPtin -MetFORMIN  HCl (JANUMET  XR) [phone] MG TB24  Has the patient contacted their pharmacy? No (Agent: If no, request that the patient contact the pharmacy for the refill. If patient does not wish to contact the pharmacy document the reason why and proceed with request.) (Agent: If yes, when and what did the pharmacy advise?)  This is the patient's preferred pharmacy:   Says he goes through patient assistance for this    Is this the correct pharmacy for this prescription? Yes If no, delete pharmacy and type the correct one.   Has the prescription been filled recently? No  Is the patient out of the medication? Yes  Has the patient been seen for an appointment in the last year OR does the patient have an upcoming appointment? Yes  Can we respond through MyChart? No  Agent: Please be advised that Rx refills may take up to 3 business days. We ask that you follow-up with your pharmacy.

## 2024-05-16 ENCOUNTER — Telehealth: Payer: Self-pay

## 2024-05-16 NOTE — Telephone Encounter (Signed)
 Can you help and tell me where I need to send a rx to?  Copied from CRM [id number]. Topic: Clinical - Medication Question >> May 16, 2024  2:17 PM [facility] wrote: Reason for CRM: Pt calling regarding the refill encounter on 05/10/24. Pt requested that his SitaGLIPtin -MetFORMIN  HCl (JANUMET  XR) [phone] MG be sent to the patient assistance program so they can mail it to him. It was sent to the local pharmacy and he would like to speak to someone about having this corrected.  Pt is asking to be called in an 1 hour because he need take his mother home and he can not talk on the phone and drive.

## 2024-05-16 NOTE — Telephone Encounter (Signed)
 Telephone call placed to [facility] to initiate refill for patient.  Patient should get medication in 7-10 days.  Gave patient number to [facility] and instructed him to call and reorder when he gets down to 2 weeks supply

## 2024-05-17 ENCOUNTER — Other Ambulatory Visit: Payer: Self-pay

## 2024-05-21 ENCOUNTER — Other Ambulatory Visit: Payer: Self-pay

## 2024-05-21 DIAGNOSIS — E669 Obesity, unspecified: Secondary | ICD-10-CM

## 2024-05-21 MED ORDER — JANUMET XR 100-1000 MG PO TB24: 1.0000 | ORAL_TABLET | Freq: Every day | ORAL | 2 refills | Status: AC
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
## 2024-07-01 NOTE — Assessment & Plan Note (Signed)
 SABRA

## 2024-07-01 NOTE — Assessment & Plan Note (Signed)
  Orders:   POCT glycosylated hemoglobin (Hb A1C)

## 2024-07-01 NOTE — Assessment & Plan Note (Signed)
  Orders:   POCT Lipid Panel

## 2024-07-02 ENCOUNTER — Ambulatory Visit (INDEPENDENT_AMBULATORY_CARE_PROVIDER_SITE_OTHER): Admitting: Family Medicine

## 2024-07-02 ENCOUNTER — Encounter: Payer: Self-pay | Admitting: Family Medicine

## 2024-07-02 VITALS — BP 128/82 | HR 78 | Temp 97.5°F | Resp 18 | Ht 70.0 in | Wt 205.8 lb

## 2024-07-02 DIAGNOSIS — E669 Obesity, unspecified: Secondary | ICD-10-CM

## 2024-07-02 DIAGNOSIS — E782 Mixed hyperlipidemia: Secondary | ICD-10-CM | POA: Diagnosis not present

## 2024-07-02 DIAGNOSIS — I69351 Hemiplegia and hemiparesis following cerebral infarction affecting right dominant side: Secondary | ICD-10-CM | POA: Diagnosis not present

## 2024-07-02 DIAGNOSIS — L02211 Cutaneous abscess of abdominal wall: Secondary | ICD-10-CM

## 2024-07-02 DIAGNOSIS — I10 Essential (primary) hypertension: Secondary | ICD-10-CM | POA: Diagnosis not present

## 2024-07-02 DIAGNOSIS — E119 Type 2 diabetes mellitus without complications: Secondary | ICD-10-CM

## 2024-07-02 LAB — POCT LIPID PANEL
HDL: 41
LDL: 89
Non-HDL: 113
TC/HDL: 2.2
TC: 155
TRG: 120

## 2024-07-02 LAB — POCT GLYCOSYLATED HEMOGLOBIN (HGB A1C): Hemoglobin A1C: 6.1 % — AB (ref 4.0–5.6)

## 2024-07-02 MED ORDER — SULFAMETHOXAZOLE-TRIMETHOPRIM 800-160 MG PO TABS: 1.0000 | ORAL_TABLET | Freq: Two times a day (BID) | ORAL | 0 refills | Status: DC

## 2024-07-02 NOTE — Assessment & Plan Note (Signed)
  Orders:   Anaerobic and Aerobic Culture

## 2024-07-09 ENCOUNTER — Ambulatory Visit: Payer: Self-pay | Admitting: Family Medicine

## 2024-07-09 ENCOUNTER — Other Ambulatory Visit: Payer: Self-pay | Admitting: Family Medicine

## 2024-07-09 DIAGNOSIS — I10 Essential (primary) hypertension: Secondary | ICD-10-CM

## 2024-07-09 LAB — ANAEROBIC AND AEROBIC CULTURE

## 2024-07-18 ENCOUNTER — Ambulatory Visit (INDEPENDENT_AMBULATORY_CARE_PROVIDER_SITE_OTHER)

## 2024-07-18 VITALS — Ht 70.0 in | Wt 205.0 lb

## 2024-07-18 DIAGNOSIS — Z0000 Encounter for general adult medical examination without abnormal findings: Secondary | ICD-10-CM

## 2024-07-18 MED ORDER — SULFAMETHOXAZOLE-TRIMETHOPRIM 800-160 MG PO TABS: 1.0000 | ORAL_TABLET | Freq: Two times a day (BID) | ORAL | 0 refills | Status: AC

## 2024-07-18 NOTE — Patient Instructions (Signed)
 Marcus Klein , Thank you for taking time to come for your Medicare Wellness Visit. I appreciate your ongoing commitment to your health goals. Please review the following plan we discussed and let me know if I can assist you in the future.   These are the goals we discussed:  Goals      Learn More About My Health     Timeframe:  Long-Range Goal Priority:  High Start Date:                             Expected End Date:                        Follow Up Date 03/2021   - tell my story and reason for my visit - repeat what I heard to make sure I understand - bring a list of my medicines to the visit - speak up when I don't understand    Why is this important?   The best way to learn about your health and care is by talking to the doctor and nurse.  They will answer your questions and give you information in the way that you like best.    Notes:      Monitor and Manage My Blood Sugar-Diabetes Type 2     Timeframe:  Long-Range Goal Priority:  High Start Date:                             Expected End Date:                       Follow Up Date 03/2021   - check blood sugar at prescribed times    Why is this important?   Checking your blood sugar at home helps to keep it from getting very high or very low.  Writing the results in a diary or log helps the doctor know how to care for you.  Your blood sugar log should have the time, date and the results.  Also, write down the amount of insulin or other medicine that you take.  Other information, like what you ate, exercise done and how you were feeling, will also be helpful.     Notes:      Track and Manage My Blood Pressure-Hypertension     Timeframe:  Long-Range Goal Priority:  High Start Date:                             Expected End Date:                       Follow Up Date 03/2021   - write blood pressure results in a log or diary    Why is this important?   You won't feel high blood pressure, but it can still hurt your  blood vessels.  High blood pressure can cause heart or kidney problems. It can also cause a stroke.  Making lifestyle changes like losing a little weight or eating less salt will help.  Checking your blood pressure at home and at different times of the day can help to control blood pressure.  If the doctor prescribes medicine remember to take it the way the doctor ordered.  Call the office if you cannot afford the medicine or  if there are questions about it.     Notes:         This is a list of the screening recommended for you and due dates:  Health Maintenance  Topic Date Due   Eye exam for diabetics  11/02/2023   COVID-19 Vaccine (1) 07/18/2024*   Flu Shot  12/04/2024*   Hemoglobin A1C  12/31/2024   Yearly kidney function blood test for diabetes  03/02/2025   Yearly kidney health urinalysis for diabetes  03/02/2025   Complete foot exam   07/02/2025   Medicare Annual Wellness Visit  07/18/2025   Colon Cancer Screening  07/02/2029   Hepatitis C Screening  Completed   Hepatitis B Vaccine  Aged Out   HPV Vaccine  Aged Out   Meningitis B Vaccine  Aged Out   DTaP/Tdap/Td vaccine  Discontinued   Pneumococcal Vaccine for age over 71  Discontinued   Zoster (Shingles) Vaccine  Discontinued  *Topic was postponed. The date shown is not the original due date.    Advanced directives: none  Next appointment: Follow up in one year for your annual wellness visit 11.18.2026 at 9:00 am.  Preventive Care 40-64 Years, Male Preventive care refers to lifestyle choices and visits with your health care provider that can promote health and wellness. What does preventive care include? A yearly physical exam. This is also called an annual well check. Dental exams once or twice a year. Routine eye exams. Ask your health care provider how often you should have your eyes checked. Personal lifestyle choices, including: Daily care of your teeth and gums. Regular physical activity. Eating a healthy  diet. Avoiding tobacco and drug use. Limiting alcohol use. Practicing safe sex. Taking low-dose aspirin every day starting at age 13. What happens during an annual well check? The services and screenings done by your health care provider during your annual well check will depend on your age, overall health, lifestyle risk factors, and family history of disease. Counseling  Your health care provider may ask you questions about your: Alcohol use. Tobacco use. Drug use. Emotional well-being. Home and relationship well-being. Sexual activity. Eating habits. Work and work astronomer. Screening  You may have the following tests or measurements: Height, weight, and BMI. Blood pressure. Lipid and cholesterol levels. These may be checked every 5 years, or more frequently if you are over 64 years old. Skin check. Lung cancer screening. You may have this screening every year starting at age 57 if you have a 30-pack-year history of smoking and currently smoke or have quit within the past 15 years. Fecal occult blood test (FOBT) of the stool. You may have this test every year starting at age 22. Flexible sigmoidoscopy or colonoscopy. You may have a sigmoidoscopy every 5 years or a colonoscopy every 10 years starting at age 10. Prostate cancer screening. Recommendations will vary depending on your family history and other risks. Hepatitis C blood test. Hepatitis B blood test. Sexually transmitted disease (STD) testing. Diabetes screening. This is done by checking your blood sugar (glucose) after you have not eaten for a while (fasting). You may have this done every 1-3 years. Discuss your test results, treatment options, and if necessary, the need for more tests with your health care provider. Vaccines  Your health care provider may recommend certain vaccines, such as: Influenza vaccine. This is recommended every year. Tetanus, diphtheria, and acellular pertussis (Tdap, Td) vaccine. You may  need a Td booster every 10 years. Zoster vaccine. You may need  this after age 52. Pneumococcal 13-valent conjugate (PCV13) vaccine. You may need this if you have certain conditions and have not been vaccinated. Pneumococcal polysaccharide (PPSV23) vaccine. You may need one or two doses if you smoke cigarettes or if you have certain conditions. Talk to your health care provider about which screenings and vaccines you need and how often you need them. This information is not intended to replace advice given to you by your health care provider. Make sure you discuss any questions you have with your health care provider. Document Released: 09/19/2015 Document Revised: 05/12/2016 Document Reviewed: 06/24/2015 Elsevier Interactive Patient Education  2017 [employer].  Fall Prevention in the Home Falls can cause injuries. They can happen to people of all ages. There are many things you can do to make your home safe and to help prevent falls. What can I do on the outside of my home? Regularly fix the edges of walkways and driveways and fix any cracks. Remove anything that might make you trip as you walk through a door, such as a raised step or threshold. Trim any bushes or trees on the path to your home. Use bright outdoor lighting. Clear any walking paths of anything that might make someone trip, such as rocks or tools. Regularly check to see if handrails are loose or broken. Make sure that both sides of any steps have handrails. Any raised decks and porches should have guardrails on the edges. Have any leaves, snow, or ice cleared regularly. Use sand or salt on walking paths during winter. Clean up any spills in your garage right away. This includes oil or grease spills. What can I do in the bathroom? Use night lights. Install grab bars by the toilet and in the tub and shower. Do not use towel bars as grab bars. Use non-skid mats or decals in the tub or shower. If you need to sit down in the  shower, use a plastic, non-slip stool. Keep the floor dry. Clean up any water that spills on the floor as soon as it happens. Remove soap buildup in the tub or shower regularly. Attach bath mats securely with double-sided non-slip rug tape. Do not have throw rugs and other things on the floor that can make you trip. What can I do in the bedroom? Use night lights. Make sure that you have a light by your bed that is easy to reach. Do not use any sheets or blankets that are too big for your bed. They should not hang down onto the floor. Have a firm chair that has side arms. You can use this for support while you get dressed. Do not have throw rugs and other things on the floor that can make you trip. What can I do in the kitchen? Clean up any spills right away. Avoid walking on wet floors. Keep items that you use a lot in easy-to-reach places. If you need to reach something above you, use a strong step stool that has a grab bar. Keep electrical cords out of the way. Do not use floor polish or wax that makes floors slippery. If you must use wax, use non-skid floor wax. Do not have throw rugs and other things on the floor that can make you trip. What can I do with my stairs? Do not leave any items on the stairs. Make sure that there are handrails on both sides of the stairs and use them. Fix handrails that are broken or loose. Make sure that handrails are as  long as the stairways. Check any carpeting to make sure that it is firmly attached to the stairs. Fix any carpet that is loose or worn. Avoid having throw rugs at the top or bottom of the stairs. If you do have throw rugs, attach them to the floor with carpet tape. Make sure that you have a light switch at the top of the stairs and the bottom of the stairs. If you do not have them, ask someone to add them for you. What else can I do to help prevent falls? Wear shoes that: Do not have high heels. Have rubber bottoms. Are comfortable and  fit you well. Are closed at the toe. Do not wear sandals. If you use a stepladder: Make sure that it is fully opened. Do not climb a closed stepladder. Make sure that both sides of the stepladder are locked into place. Ask someone to hold it for you, if possible. Clearly mark and make sure that you can see: Any grab bars or handrails. First and last steps. Where the edge of each step is. Use tools that help you move around (mobility aids) if they are needed. These include: Canes. Walkers. Scooters. Crutches. Turn on the lights when you go into a dark area. Replace any light bulbs as soon as they burn out. Set up your furniture so you have a clear path. Avoid moving your furniture around. If any of your floors are uneven, fix them. If there are any pets around you, be aware of where they are. Review your medicines with your doctor. Some medicines can make you feel dizzy. This can increase your chance of falling. Ask your doctor what other things that you can do to help prevent falls. This information is not intended to replace advice given to you by your health care provider. Make sure you discuss any questions you have with your health care provider. Document Released: 06/19/2009 Document Revised: 01/29/2016 Document Reviewed: 09/27/2014 Elsevier Interactive Patient Education  2017 [employer].
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
## 2024-07-24 ENCOUNTER — Telehealth: Payer: Self-pay

## 2024-07-24 NOTE — Telephone Encounter (Signed)
 PAP: Patient assistance application for Januvia  through [employer] has been mailed to pt's home address on file. Provider portion of application will be faxed to provider's office.  Provider portion has been faxed to Dr. Abigail Free

## 2024-07-26 NOTE — Telephone Encounter (Signed)
 Received provider portion of application

## 2024-08-06 NOTE — Telephone Encounter (Signed)
 PAP: Application for Janumet  has been submitted to [employer], via fax

## 2024-08-21 NOTE — Telephone Encounter (Signed)
 Spoke with [employer] they did not receive application-will refax completed application

## 2024-09-04 ENCOUNTER — Telehealth: Payer: Self-pay

## 2024-09-04 NOTE — Telephone Encounter (Signed)
 He is here wanting to know if he can get a sample of Ozempic , he will run out before his patient assistance will come in.  One sample of ozempic  was given.

## 2024-09-11 NOTE — Telephone Encounter (Signed)
 PAP: Patient assistance application for Januvia  has been approved by PAP Companies: [employer] from 09/06/2024 to 09/05/2025. Medication should be delivered to PAP Delivery: Home. For further shipping updates, please contact [employer] at [phone]. Patient ID is: no ID given

## 2024-11-02 ENCOUNTER — Ambulatory Visit: Admitting: Family Medicine

## 2025-07-24 ENCOUNTER — Ambulatory Visit
